# Patient Record
Sex: Male | Born: 1970 | Race: White | Hispanic: Yes | Smoking: Never smoker
Health system: Southern US, Community
[De-identification: ages and names within clinical notes are randomized; demographics above are authoritative.]

## PROBLEM LIST (undated history)

## (undated) DIAGNOSIS — I10 Essential (primary) hypertension: Secondary | ICD-10-CM

## (undated) DIAGNOSIS — E7800 Pure hypercholesterolemia, unspecified: Secondary | ICD-10-CM

## (undated) DIAGNOSIS — N529 Male erectile dysfunction, unspecified: Secondary | ICD-10-CM

## (undated) DIAGNOSIS — E119 Type 2 diabetes mellitus without complications: Secondary | ICD-10-CM

## (undated) DIAGNOSIS — K649 Unspecified hemorrhoids: Secondary | ICD-10-CM

## (undated) HISTORY — PX: NO PAST SURGERIES: SHX2092

---

## 2012-09-26 ENCOUNTER — Emergency Department: Payer: Self-pay | Admitting: Emergency Medicine

## 2014-01-04 ENCOUNTER — Emergency Department: Payer: Self-pay | Admitting: Emergency Medicine

## 2014-07-14 DIAGNOSIS — E119 Type 2 diabetes mellitus without complications: Secondary | ICD-10-CM | POA: Insufficient documentation

## 2014-07-14 DIAGNOSIS — I10 Essential (primary) hypertension: Secondary | ICD-10-CM | POA: Insufficient documentation

## 2015-05-09 ENCOUNTER — Encounter: Payer: Self-pay | Admitting: Emergency Medicine

## 2015-05-09 ENCOUNTER — Ambulatory Visit
Admission: EM | Admit: 2015-05-09 | Discharge: 2015-05-09 | Disposition: A | Discharge status: Home / Self Care | Payer: BLUE CROSS/BLUE SHIELD | Attending: Family Medicine | Admitting: Family Medicine

## 2015-05-09 DIAGNOSIS — K648 Other hemorrhoids: Secondary | ICD-10-CM | POA: Diagnosis not present

## 2015-05-09 DIAGNOSIS — K644 Residual hemorrhoidal skin tags: Secondary | ICD-10-CM

## 2015-05-09 DIAGNOSIS — K5900 Constipation, unspecified: Secondary | ICD-10-CM

## 2015-05-09 HISTORY — DX: Essential (primary) hypertension: I10

## 2015-05-09 HISTORY — DX: Type 2 diabetes mellitus without complications: E11.9

## 2015-05-09 MED ORDER — HYDROCORTISONE 2.5 % RE CREA: TOPICAL_CREAM | RECTAL | Status: DC

## 2015-05-09 MED ORDER — HYDROCORTISONE ACETATE 25 MG RE SUPP: 25.0000 mg | Freq: Two times a day (BID) | RECTAL | Status: DC

## 2015-05-09 NOTE — Discharge Instructions (Signed)
Hemorrhoids Hemorrhoids are swollen veins around the rectum or anus. There are two types of hemorrhoids:  1. Internal hemorrhoids. These occur in the veins just inside the rectum. They may poke through to the outside and become irritated and painful. 2. External hemorrhoids. These occur in the veins outside the anus and can be felt as a painful swelling or hard lump near the anus. CAUSES  Pregnancy.   Obesity.   Constipation or diarrhea.   Straining to have a bowel movement.   Sitting for long periods on the toilet.  Heavy lifting or other activity that caused you to strain.  Anal intercourse. SYMPTOMS   Pain.   Anal itching or irritation.   Rectal bleeding.   Fecal leakage.   Anal swelling.   One or more lumps around the anus.  DIAGNOSIS  Your caregiver may be able to diagnose hemorrhoids by visual examination. Other examinations or tests that may be performed include:   Examination of the rectal area with a gloved hand (digital rectal exam).   Examination of anal canal using a small tube (scope).   A blood test if you have lost a significant amount of blood.  A test to look inside the colon (sigmoidoscopy or colonoscopy). TREATMENT Most hemorrhoids can be treated at home. However, if symptoms do not seem to be getting better or if you have a lot of rectal bleeding, your caregiver may perform a procedure to help make the hemorrhoids get smaller or remove them completely. Possible treatments include:   Placing a rubber band at the base of the hemorrhoid to cut off the circulation (rubber band ligation).   Injecting a chemical to shrink the hemorrhoid (sclerotherapy).   Using a tool to burn the hemorrhoid (infrared light therapy).   Surgically removing the hemorrhoid (hemorrhoidectomy).   Stapling the hemorrhoid to block blood flow to the tissue (hemorrhoid stapling).  HOME CARE INSTRUCTIONS   Eat foods with fiber, such as whole grains, beans,  nuts, fruits, and vegetables. Ask your doctor about taking products with added fiber in them (fibersupplements).  Increase fluid intake. Drink enough water and fluids to keep your urine clear or pale yellow.   Exercise regularly.   Go to the bathroom when you have the urge to have a bowel movement. Do not wait.   Avoid straining to have bowel movements.   Keep the anal area dry and clean. Use wet toilet paper or moist towelettes after a bowel movement.   Medicated creams and suppositories may be used or applied as directed.   Only take over-the-counter or prescription medicines as directed by your caregiver.   Take warm sitz baths for 15-20 minutes, 3-4 times a day to ease pain and discomfort.   Place ice packs on the hemorrhoids if they are tender and swollen. Using ice packs between sitz baths may be helpful.   Put ice in a plastic bag.   Place a towel between your skin and the bag.   Leave the ice on for 15-20 minutes, 3-4 times a day.   Do not use a donut-shaped pillow or sit on the toilet for long periods. This increases blood pooling and pain.  SEEK MEDICAL CARE IF:  You have increasing pain and swelling that is not controlled by treatment or medicine.  You have uncontrolled bleeding.  You have difficulty or you are unable to have a bowel movement.  You have pain or inflammation outside the area of the hemorrhoids. MAKE SURE YOU:  Understand these instructions.  Will watch your condition.  Will get help right away if you are not doing well or get worse. Document Released: 09/07/2000 Document Revised: 08/27/2012 Document Reviewed: 07/15/2012 [facility] Patient Information 2015 [place], [place]. This information is not intended to replace advice given to you by your health care provider. Make sure you discuss any questions you have with your health care provider. Methylcellulose capsules or tablets What is this medicine? METHYLCELLULOSE (meth ill SELL yoo  lose) is a bulk-forming laxative. This medicine is used to treat constipation. This medicine may be used for other purposes; ask your health care provider or pharmacist if you have questions. COMMON BRAND NAME(S): Citrucel, Fiber Therapy What should I tell my health care provider before I take this medicine? They need to know if you have any of these conditions: -blockage of the intestines or bowel -change in bowel habits for more than 14 days -nausea or vomiting -phenylketonuria -stomach pain -trouble swallowing -an unusual or allergic reaction to methylcellulose, other medicines, foods, dyes, or preservatives -pregnant or trying or get pregnant -breast-feeding How should I use this medicine? Take this medicine by mouth with a full glass of water. Follow the directions on the package labeling, or take as directed by your health care professional. Take your medicine at regular intervals. Do not take your medicine more often than directed. Talk to your pediatrician regarding the use of this medicine in children. While this drug may be prescribed for children as young as 50 years old for selected conditions, precautions do apply. Overdosage: If you think you have taken too much of this medicine contact a poison control center or emergency room at once. NOTE: This medicine is only for you. Do not share this medicine with others. What if I miss a dose? If you miss a dose, take it as soon as you can. If it is almost time for your next dose, take only that dose. Do not take double or extra doses. What may interact with this medicine? Interactions are not expected. This list may not describe all possible interactions. Give your health care provider a list of all the medicines, herbs, non-prescription drugs, or dietary supplements you use. Also tell them if you smoke, drink alcohol, or use illegal drugs. Some items may interact with your medicine. What should I watch for while using this  medicine? This medicine can take up to 3 days to work. Check with your doctor or health care professional if your symptoms do not start to get better or if they get worse. See your doctor if you have to treat your constipation for more than 1 week. Avoid taking other medicines within 2 hours of taking this medicine. Drink several glasses of water a day while you are taking this medicine. This will help to relieve constipation and prevent dehydration. What side effects may I notice from receiving this medicine? Side effects that you should report to your doctor or health care professional as soon as possible: -allergic reactions like skin rash, itching or hives, swelling of the face, lips, or tongue -breathing problems -chest pain -nausea, vomiting -rectal bleeding -trouble swallowing Side effects that usually do not require medical attention (report to your doctor or health care professional if they continue or are bothersome): -diarrhea -headache -stomach cramps This list may not describe all possible side effects. Call your doctor for medical advice about side effects. You may report side effects to FDA at 1-800-FDA-1088. Where should I keep my medicine? Keep out of the reach of children.  Store at room temperature between 15 and 30 degrees C (59 and 86 degrees F). Do not freeze. Protect from moisture. Throw away any unused medicine after the expiration date. NOTE: This sheet is a summary. It may not cover all possible information. If you have questions about this medicine, talk to your doctor, pharmacist, or health care provider.  2015, [employer]. (2008-03-29 15:12:13) High-Fiber Diet Fiber is found in fruits, vegetables, and grains. A high-fiber diet encourages the addition of more whole grains, legumes, fruits, and vegetables in your diet. The recommended amount of fiber for adult males is 38 g per day. For adult females, it is 25 g per day. Pregnant and lactating women  should get 28 g of fiber per day. If you have a digestive or bowel problem, ask your caregiver for advice before adding high-fiber foods to your diet. Eat a variety of high-fiber foods instead of only a select few type of foods.  PURPOSE 3. To increase stool bulk. 4. To make bowel movements more regular to prevent constipation. 5. To lower cholesterol. 6. To prevent overeating. WHEN IS THIS DIET USED?  It may be used if you have constipation and hemorrhoids.  It may be used if you have uncomplicated diverticulosis (intestine condition) and irritable bowel syndrome.  It may be used if you need help with weight management.  It may be used if you want to add it to your diet as a protective measure against atherosclerosis, diabetes, and cancer. SOURCES OF FIBER  Whole-grain breads and cereals.  Fruits, such as apples, oranges, bananas, berries, prunes, and pears.  Vegetables, such as green peas, carrots, sweet potatoes, beets, broccoli, cabbage, spinach, and artichokes.  Legumes, such split peas, soy, lentils.  Almonds. FIBER CONTENT IN FOODS Starches and Grains / Dietary Fiber (g)  Cheerios, 1 cup / 3 g  Corn Flakes cereal, 1 cup / 0.7 g  Rice crispy treat cereal, 1 cup / 0.3 g  Instant oatmeal (cooked),  cup / 2 g  Frosted wheat cereal, 1 cup / 5.1 g  Brown, long-grain rice (cooked), 1 cup / 3.5 g  White, long-grain rice (cooked), 1 cup / 0.6 g  Enriched macaroni (cooked), 1 cup / 2.5 g Legumes / Dietary Fiber (g)  Baked beans (canned, plain, or vegetarian),  cup / 5.2 g  Kidney beans (canned),  cup / 6.8 g  Pinto beans (cooked),  cup / 5.5 g Breads and Crackers / Dietary Fiber (g)  Plain or honey graham crackers, 2 squares / 0.7 g  Saltine crackers, 3 squares / 0.3 g  Plain, salted pretzels, 10 pieces / 1.8 g  Whole-wheat bread, 1 slice / 1.9 g  White bread, 1 slice / 0.7 g  Raisin bread, 1 slice / 1.2 g  Plain bagel, 3 oz / 2 g  Flour tortilla,  1 oz / 0.9 g  Corn tortilla, 1 small / 1.5 g  Hamburger or hotdog bun, 1 small / 0.9 g Fruits / Dietary Fiber (g)  Apple with skin, 1 medium / 4.4 g  Sweetened applesauce,  cup / 1.5 g  Banana,  medium / 1.5 g  Grapes, 10 grapes / 0.4 g  Orange, 1 small / 2.3 g  Raisin, 1.5 oz / 1.6 g  Melon, 1 cup / 1.4 g Vegetables / Dietary Fiber (g)  Green beans (canned),  cup / 1.3 g  Carrots (cooked),  cup / 2.3 g  Broccoli (cooked),  cup / 2.8 g  Peas (cooked),  cup / 4.4 g  Mashed potatoes,  cup / 1.6 g  Lettuce, 1 cup / 0.5 g  Corn (canned),  cup / 1.6 g  Tomato,  cup / 1.1 g Document Released: 09/10/2005 Document Revised: 03/11/2012 Document Reviewed: 12/13/2011 [facility] Patient Information 2015 [place], [place]. This information is not intended to replace advice given to you by your health care provider. Make sure you discuss any questions you have with your health care provider. Constipation Constipation is when a person has fewer than three bowel movements a week, has difficulty having a bowel movement, or has stools that are dry, hard, or larger than normal. As people grow older, constipation is more common. If you try to fix constipation with medicines that make you have a bowel movement (laxatives), the problem may get worse. Long-term laxative use may cause the muscles of the colon to become weak. A low-fiber diet, not taking in enough fluids, and taking certain medicines may make constipation worse.  CAUSES  7. Certain medicines, such as antidepressants, pain medicine, iron supplements, antacids, and water pills.  8. Certain diseases, such as diabetes, irritable bowel syndrome (IBS), thyroid disease, or depression.  9. Not drinking enough water.  10. Not eating enough fiber-rich foods.  11. Stress or travel.  12. Lack of physical activity or exercise.  13. Ignoring the urge to have a bowel movement.  14. Using laxatives too much.  SIGNS AND SYMPTOMS    Having fewer than three bowel movements a week.   Straining to have a bowel movement.   Having stools that are hard, dry, or larger than normal.   Feeling full or bloated.   Pain in the lower abdomen.   Not feeling relief after having a bowel movement.  DIAGNOSIS  Your health care provider will take a medical history and perform a physical exam. Further testing may be done for severe constipation. Some tests may include:  A barium enema X-ray to examine your rectum, colon, and, sometimes, your small intestine.   A sigmoidoscopy to examine your lower colon.   A colonoscopy to examine your entire colon. TREATMENT  Treatment will depend on the severity of your constipation and what is causing it. Some dietary treatments include drinking more fluids and eating more fiber-rich foods. Lifestyle treatments may include regular exercise. If these diet and lifestyle recommendations do not help, your health care provider may recommend taking over-the-counter laxative medicines to help you have bowel movements. Prescription medicines may be prescribed if over-the-counter medicines do not work.  HOME CARE INSTRUCTIONS   Eat foods that have a lot of fiber, such as fruits, vegetables, whole grains, and beans.  Limit foods high in fat and processed sugars, such as french fries, hamburgers, cookies, candies, and soda.   A fiber supplement may be added to your diet if you cannot get enough fiber from foods.   Drink enough fluids to keep your urine clear or pale yellow.   Exercise regularly or as directed by your health care provider.   Go to the restroom when you have the urge to go. Do not hold it.   Only take over-the-counter or prescription medicines as directed by your health care provider. Do not take other medicines for constipation without talking to your health care provider first.  SEEK IMMEDIATE MEDICAL CARE IF:   You have bright red blood in your stool.   Your  constipation lasts for more than 4 days or gets worse.   You have abdominal or rectal pain.  You have thin, pencil-like stools.   You have unexplained weight loss. MAKE SURE YOU:   Understand these instructions.  Will watch your condition.  Will get help right away if you are not doing well or get worse. Document Released: 06/08/2004 Document Revised: 09/15/2013 Document Reviewed: 06/22/2013 [facility] Patient Information 2015 [place], [place]. This information is not intended to replace advice given to you by your health care provider. Make sure you discuss any questions you have with your health care provider. Disposable Sitz Bath A disposable sitz bath is a plastic basin that fits over the toilet. A bag is hung above the toilet and is connected to a tube that opens into the disposable sitz bath. The bag is filled with warm water that can flow into the basin through the tube.  HOW TO USE A DISPOSABLE SITZ BATH 15. Close the clamp on the tubing before filling the bag with water. This is to prevent leakage. 16. Fill the sitz bath basin and the plastic bag with warm water. 17. Place the filled basin on the toilet with the seat raised. Make sure the overflow opening is facing toward the back of the toilet. 18. Hang the filled plastic bag overhead on a hook or towel rack close to the toilet. When the bag is unclamped, a steady stream of water will flow from the bag, through the tubing, and into the basin. 19. Attach the tubing to the opening on the basin. 20. Sit on the basin positioned on the toilet seat and release the clamp. This will allow warm water to flush the area around your genitals and anus (perineum). 21. Remain sitting on the basin for approximately 15 to 20 minutes. 22. Stand up and pat the perineum area dry. If needed, apply clean bandages (dressings) to the affected area. 23. Tip the basin into the toilet to remove any remaining water and flush the toilet. 24. Wash the basin  with warm water and soap. Let it dry in the sink. 25. Store the basin and tubing in a clean, dry area. 26. Wash your hands with soap and water. SEEK MEDICAL CARE IF: You get worse instead of better. Stop the sitz baths if you get worse. MAKE SURE YOU:  Understand these instructions.  Will watch your condition.  Will get help right away if you are not doing well or get worse. Document Released: 03/11/2012 Document Revised: 06/04/2012 Document Reviewed: 03/11/2012 [facility] Patient Information 2015 [place], [place]. This information is not intended to replace advice given to you by your health care provider. Make sure you discuss any questions you have with your health care provider. Sitz Bath A sitz bath is a warm water bath taken in the sitting position that covers only the hips and buttocks. It may be used for either healing or hygiene purposes. Sitz baths are also used to relieve pain, itching, or muscle spasms. The water may contain medicine. Moist heat will help you heal and relax.  HOME CARE INSTRUCTIONS  Take 3 to 4 sitz baths a day. 27. Fill the bathtub half full with warm water. 28. Sit in the water and open the drain a little. 29. Turn on the warm water to keep the tub half full. Keep the water running constantly. 30. Soak in the water for 15 to 20 minutes. 31. After the sitz bath, pat the affected area dry first. SEEK MEDICAL CARE IF:  You get worse instead of better. Stop the sitz baths if you get worse. MAKE SURE YOU:  Understand these instructions.  Will watch your condition.  Will get help right away if you are not doing well or get worse. Document Released: 06/02/2004 Document Revised: 06/04/2012 Document Reviewed: 12/08/2010 [facility] Patient Information 2015 [place], [place]. This information is not intended to replace advice given to you by your health care provider. Make sure you discuss any questions you have with your health care provider.

## 2015-05-09 NOTE — ED Provider Notes (Signed)
CSN: [id number]     Arrival date & time 05/09/15  1105 History   First MD Initiated Contact with Patient 05/09/15 1149     Chief Complaint  Patient presents with  . Rectal Pain   (Consider location/radiation/quality/duration/timing/severity/associated sxs/prior Treatment) HPI Comments: Married hispanic male here today for buttock pain/lump noted when wiping, tender.  Denied blood in toilet or on toilet paper.  Denied history of hemorrhoids.  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  Typically lifting items 3 hour shifts with coworker.  Has been applying OTC preparation H cream to affected area helping a little.  Denied fever, chills, diarrhea, abdomen pain, rash.  The history is provided by the patient and the spouse.    Past Medical History  Diagnosis Date  . Hypertension   . Diabetes mellitus without complication    History reviewed. No pertinent past surgical history. History reviewed. No pertinent family history. Social History  Substance Use Topics  . Smoking status: Never Smoker   . Smokeless tobacco: None  . Alcohol Use: No    Review of Systems  Constitutional: Negative for fever, chills, diaphoresis, activity change, appetite change and fatigue.  HENT: Negative for congestion, dental problem, drooling, ear discharge, ear pain, facial swelling, hearing loss and mouth sores.   Eyes: Negative for photophobia, pain, discharge, redness, itching and visual disturbance.  Respiratory: Negative for cough, choking, chest tightness, shortness of breath, wheezing and stridor.   Cardiovascular: Negative for chest pain and leg swelling.  Gastrointestinal: Positive for constipation and rectal pain. Negative for nausea, vomiting, abdominal pain, diarrhea, blood in stool, abdominal distention and anal bleeding.  Endocrine: Negative for cold intolerance and heat intolerance.  Genitourinary: Negative for hematuria  and difficulty urinating.  Musculoskeletal: Negative for myalgias, back pain, joint swelling, arthralgias, gait problem, neck pain and neck stiffness.  Skin: Negative for color change, pallor, rash and wound.  Allergic/Immunologic: Positive for immunocompromised state. Negative for environmental allergies and food allergies.  Neurological: Negative for dizziness, tremors, seizures, syncope, facial asymmetry, speech difficulty, weakness, light-headedness, numbness and headaches.  Hematological: Negative for adenopathy. Does not bruise/bleed easily.  Psychiatric/Behavioral: Negative for behavioral problems, confusion, sleep disturbance and agitation.    Allergies  Review of patient's allergies indicates no known allergies.  Home Medications   Prior to Admission medications   Medication Sig Start Date End Date Taking? Authorizing Provider  aspirin 81 MG tablet Take 81 mg by mouth daily.   Yes Historical Provider, MD  lisinopril (PRINIVIL,ZESTRIL) 20 MG tablet Take 20 mg by mouth daily.   Yes Historical Provider, MD  metFORMIN (GLUCOPHAGE) 850 MG tablet Take 850 mg by mouth 2 (two) times daily with a meal.   Yes Historical Provider, MD  hydrocortisone (ANUSOL-HC) 2.5 % rectal cream Apply rectally 3-4 times daily if itching 05/09/15   Barbaraann Barthel, NP  hydrocortisone (ANUSOL-HC) 25 MG suppository Place 1 suppository (25 mg total) rectally 2 (two) times daily. 05/09/15   Jarold Song Breeana Sawtelle, NP   BP 131/70 mmHg  Pulse 91  Temp(Src) 98.3 F (36.8 C) (Oral)  Resp 16  SpO2 97% Physical Exam  Constitutional: He is oriented to person, place, and time. Vital signs are normal. He appears well-developed and well-nourished. No distress.  HENT:  Head: Normocephalic and atraumatic.  Right Ear: External ear normal.  Left Ear: External ear normal.  Nose: Nose normal.  Mouth/Throat: Oropharynx is clear and moist. No  oropharyngeal exudate.  Eyes: Conjunctivae, EOM and lids are normal. Pupils are  equal, round, and reactive to light. Right eye exhibits no discharge. Left eye exhibits no discharge. No scleral icterus.  Neck: Trachea normal and normal range of motion. Neck supple. No tracheal deviation present.  Cardiovascular: Normal rate, regular rhythm, normal heart sounds and intact distal pulses.  Exam reveals no gallop and no friction rub.   No murmur heard. Pulmonary/Chest: Effort normal and breath sounds normal. No stridor. No respiratory distress. He has no wheezes. He has no rales.  Abdominal: Soft. Bowel sounds are normal. He exhibits no distension and no mass. There is no tenderness. There is no rebound and no guarding.  Genitourinary: Rectal exam shows external hemorrhoid and tenderness. Rectal exam shows no fissure and anal tone normal.  RN Blima Ledger chaperoned rectal exam; 1 cm diameter white cream on affected area single 6 oclock position tender skin not erythematous perineum  Musculoskeletal: Normal range of motion. He exhibits no edema or tenderness.  Neurological: He is alert and oriented to person, place, and time. He exhibits normal muscle tone. Coordination normal.  Skin: Skin is warm, dry and intact. No rash noted. He is not diaphoretic. No erythema. No pallor.  Psychiatric: He has a normal mood and affect. His speech is normal and behavior is normal. Judgment and thought content normal. Cognition and memory are normal.  Nursing note and vitals reviewed.   ED Course  Procedures (including critical care time) Labs Review Labs Reviewed - No data to display  Imaging Review No results found.   MDM   1. External hemorrhoid   2. Constipation, unspecified constipation type    Rx anusol suppositories for rectal pain and hydrocortisone rectal cream may use if rectal itching. Work note restriction avoid lifting greater than 50lbs x 7 days.  Avoid holding breath with lifting items or having bowel movements.  Discussed ergonomics and proper lifting techniques to avoid  injury. The following treatments usually help to relieve most cases of hemorrhoids:  . High-fiber diet Eat more high-fiber foods, which will help prevent constipation.  The best sources of fiber are whole-grain cereals, such as shredded wheat or cereals with bran.  Fresh fruit and raw or cooked vegetables, especially asparagus, cabbage, carrots, corn, and broccoli are other good sources of fiber.  If unable to add dietary fiber consider fiber supplement OTC titrate to effect. . Fluids  Drink plenty of water.  This helps to soften bowel movements so they are easier to pass.   . Sitz baths and cold packs Sitting in lukewarm water 2 or 3 times a day for 15 minutes cleans the anal area and may relieve discomfort.  (If the bath water is too hot, swelling around the anus will get worse.)  Also, you might try putting a cloth-covered ice pack on the anus for 10 minutes, 4 times a day.   . Medications  For mild discomfort, your healthcare provider may prescribe a cream or ointment for the painful area.  The cream may contain witch hazel, zinc oxide, or petroleum jelly.  Your provider may also prescribe medicated suppositories to put inside the rectum.  Exitcare handout on hemorrhoids, sitz baths, disposable sitz baths, constipation and high fiber diet given to patient. Patient and spouse agreed with plan of care and verbalized understanding of instructions and had no further questions at this time.  P2:   Diet and fitness  Discussed with patient:  High-fiber diet Eat more high-fiber foods, which will  help prevent constipation.  The best sources of fiber are whole-grain cereals, such as shredded wheat or cereals with bran.  Fresh fruit and raw or cooked vegetables, especially asparagus, cabbage, carrots, corn, and broccoli are other good sources of fiber.   . Fluids  Drink plenty of water.  This helps to soften bowel movements so they are easier to pass.   Exercise.   Attempt diet modification.  Patient given  Exitcare handout on constipation/dietary fiber.  Patient and spouse agreed with plan of care verbalized understanding of information/instructions and had no further questions at this time. P2:  increase fruits/fiber/whole grains and fluid intake  Barbaraann Barthel, NP 05/09/15 1335

## 2015-05-09 NOTE — ED Notes (Signed)
Pt states that he lifted a box on 05/07/2015 night at work and states that he felt the pain in his rectal area

## 2015-05-11 ENCOUNTER — Ambulatory Visit
Admission: EM | Admit: 2015-05-11 | Discharge: 2015-05-11 | Disposition: A | Discharge status: Home / Self Care | Payer: BLUE CROSS/BLUE SHIELD | Attending: Family Medicine | Admitting: Family Medicine

## 2015-05-11 ENCOUNTER — Telehealth: Payer: Self-pay

## 2015-05-11 DIAGNOSIS — K644 Residual hemorrhoidal skin tags: Secondary | ICD-10-CM

## 2015-05-11 DIAGNOSIS — K648 Other hemorrhoids: Secondary | ICD-10-CM

## 2015-05-11 HISTORY — DX: Unspecified hemorrhoids: K64.9

## 2015-05-11 NOTE — ED Notes (Signed)
Examined by Dr. Concha Se. Refuses any treatment. Encouraged that needs to go to ER now for evaluation to rule out any medical problems/crisis. Patient refuses to go to [facility] ER and does not want to go to [facility] ER. AMA form signed and patient left ambulatory. Also noted that patient admits to drinking 3-4 beers daily. Breath smelled of alcohol

## 2015-05-11 NOTE — ED Notes (Signed)
Per Dr. Jolene Provost, patient to have surgical consultation. No referel found, however this nurse took verbal order from Dr. Allena Katz. Michelle at Dr. Marlowe Kays office called and will contact the patient.

## 2015-05-11 NOTE — ED Provider Notes (Signed)
Patient presents today for follow-up regarding external hemorrhoid. Patient states that his symptoms are slightly better. He is using the Anusol HC suppository. He denies any bleeding at this time. Denies fever, abdominal pain. He does have to go to work today and tomorrow where he is required to lift. He has been drinking prune juice and has managed to not be constipated. He did eat some spicy food yesterday which may have aggravated his symptoms.  ROS: Negative except mentioned above.  GENERAL: NAD HEENT: pharyngeal erythema, no exudate RESP: CTA B CARD: RRR RECTAL: tender lesion protruding from the rectum (likely hemorrhoid), does not appear to be thrombosed, no bleeding noted  NEURO: CN II-XII grossly intact   A/P: External hemorrhoid- it appears that the Anusol [facility] is helping, would recommend that patient see GI/General Surgery in case the area gets worse, will make this referral today in Epic, discussed sitz baths, patient's work requires him to lift so we will give him a work excuse for the next 2 days. If any worsening symptoms before his appointment with GI/General Surgery he is to return here.   Jolene Provost, MD 05/11/15 [phone]

## 2015-05-14 ENCOUNTER — Encounter: Payer: Self-pay | Admitting: Gynecology

## 2015-05-14 ENCOUNTER — Ambulatory Visit
Admission: EM | Admit: 2015-05-14 | Discharge: 2015-05-14 | Disposition: A | Discharge status: Home / Self Care | Payer: BLUE CROSS/BLUE SHIELD | Attending: Family Medicine | Admitting: Family Medicine

## 2015-05-14 DIAGNOSIS — K648 Other hemorrhoids: Secondary | ICD-10-CM | POA: Diagnosis not present

## 2015-05-14 DIAGNOSIS — K644 Residual hemorrhoidal skin tags: Secondary | ICD-10-CM

## 2015-05-14 MED ORDER — HYDROCORTISONE ACETATE 25 MG RE SUPP: 25.0000 mg | Freq: Two times a day (BID) | RECTAL | Status: DC

## 2015-05-14 NOTE — ED Provider Notes (Signed)
Patient returns today for follow-up regarding external hemorrhoid. Patient states that the area has started to bleed now. It is slightly more painful now. He only has one more suppository left. His appointment with surgery is on Wednesday. He denies any fevers or chills or constipation. He is worried about returning to work where he has to lift Monday and Tuesday. Denies any other problems.  ROS: Negative except mentioned above. Vitals as per Epic.  GENERAL: NAD RESP: CTA B CARD: RRR RECTAL: mildly thrombosed external hemorrhoid with no acute bleeding at this time NEURO: CN II-XII grossly intact   A/P: External Hemorrhoid- continue to follow the instructions initially given to the patient such as sitz baths, stool softeners, etc. Will refill patient's Anusol HC suppository. He will follow up with general surgery on Wednesday. I will give him a note for work with restrictions that state he is not to lift for the next 2 days. If any acute worsening symptoms patient is to seek medical attention.  Jolene Provost, MD 05/14/15 [phone]

## 2015-05-14 NOTE — ED Notes (Signed)
Per patient seen on 8/15 and 8/17 and not doing any better. Pt. Stated appt. With [facility] surgical on 05/19/2015

## 2015-05-15 ENCOUNTER — Ambulatory Visit
Admission: EM | Admit: 2015-05-15 | Discharge: 2015-05-15 | Disposition: A | Discharge status: Home / Self Care | Payer: BLUE CROSS/BLUE SHIELD

## 2015-05-16 ENCOUNTER — Encounter: Payer: Self-pay | Admitting: Surgery

## 2015-05-16 ENCOUNTER — Ambulatory Visit (INDEPENDENT_AMBULATORY_CARE_PROVIDER_SITE_OTHER): Payer: BLUE CROSS/BLUE SHIELD | Admitting: Surgery

## 2015-05-16 VITALS — BP 166/76 | HR 102 | Temp 97.8°F | Ht 64.0 in | Wt 220.0 lb

## 2015-05-16 DIAGNOSIS — K645 Perianal venous thrombosis: Secondary | ICD-10-CM | POA: Insufficient documentation

## 2015-05-16 DIAGNOSIS — K625 Hemorrhage of anus and rectum: Secondary | ICD-10-CM | POA: Insufficient documentation

## 2015-05-16 MED ORDER — LIDOCAINE 5 % EX OINT: 1.0000 "application " | TOPICAL_OINTMENT | Freq: Three times a day (TID) | CUTANEOUS | Status: DC | PRN

## 2015-05-16 NOTE — Progress Notes (Signed)
Patient ID: John Stephens, male   DOB: 08-05-1971, 44 y.o.   MRN: [id number]  Chief Complaint  Patient presents with  . Hemorrhoids    Severe pain and bleeding    HPI  John Stephens is a 44 y.o. male.  without medical history except for that of diabetes presents to the office with an 8 day history of acute onset of perianal pain followed by bleeding starting last week. He states that he went to work had a lot of heavy lifting to do went home went to bed awoke with the sudden onset of perianal pain and a mass in the anal canal.  He denies any prior history of hemorrhoidal-type symptoms nor hemorrhoid surgery. He denies any prior anal rectal bleeding.  He visited urgent care who started him on Anusol [facility] and referred him to our office for further evaluation and management. He denies any fevers or acute urinary retention. At the anal rectal pain is improving since it sudden onset 8 days ago  Past Medical History  Diagnosis Date  . Hypertension   . Diabetes mellitus without complication   . Hemorrhoids     Past Surgical History  Procedure Laterality Date  . No past surgeries      Family History  Problem Relation Age of Onset  . Diabetes Mother   . Diabetes Father     Social History Social History  Substance Use Topics  . Smoking status: Never Smoker   . Smokeless tobacco: Never Used  . Alcohol Use: No    No Known Allergies  Current Outpatient Prescriptions  Medication Sig Dispense Refill  . aspirin 81 MG tablet Take 81 mg by mouth daily.    Marland Kitchen glipiZIDE (GLUCOTROL) 10 MG tablet Take 1 tablet by mouth 2 (two) times daily.  5  . hydrocortisone (ANUSOL-HC) 2.5 % rectal cream Apply rectally 3-4 times daily if itching 28.35 g 0  . hydrocortisone (ANUSOL-HC) 25 MG suppository Place 1 suppository (25 mg total) rectally 2 (two) times daily. 12 suppository 0  . lidocaine (XYLOCAINE) 5 % ointment Apply 1 application topically 3 (three) times daily as needed for moderate pain (anal  pain). 30 g 0  . lisinopril (PRINIVIL,ZESTRIL) 20 MG tablet Take 20 mg by mouth daily.    Marland Kitchen lovastatin (MEVACOR) 40 MG tablet Take 1 tablet by mouth daily.  1  . metFORMIN (GLUCOPHAGE) 850 MG tablet Take 850 mg by mouth 2 (two) times daily with a meal.     No current facility-administered medications for this visit.    Blood pressure 166/76, pulse 102, temperature 97.8 F (36.6 C), temperature source Oral, height  (1.626 m), weight 220 lb (99.791 kg).  No results found for this or any previous visit (from the past 48 hour(s)). No results found.  Review of Systems  Constitutional: Negative for fever, chills and weight loss.  HENT: Negative for hearing loss.   Respiratory: Negative.   Cardiovascular: Negative.   Gastrointestinal: Positive for blood in stool. Negative for heartburn, nausea, vomiting, abdominal pain, diarrhea, constipation and melena.  Genitourinary: Negative for dysuria, urgency and frequency.  Neurological: Negative.  Negative for headaches.  Psychiatric/Behavioral: Negative.   All other systems reviewed and are negative.   Physical Exam  Constitutional: He is oriented to person, place, and time and well-developed, well-nourished, and in no distress. No distress.  HENT:  Head: Normocephalic.  Eyes: Conjunctivae are normal. Pupils are equal, round, and reactive to light.  Cardiovascular: Normal rate.   Pulmonary/Chest:  Effort normal.  Abdominal: Soft. There is no tenderness.  Genitourinary:  On the right lateral aspect of the anal canal isn't evidence of a recently thrombosed external hemorrhoid. Clot has been fully evacuated. There is no active bleeding present. Rectal examination was deferred secondary to pain and discomfort.  Neurological: He is oriented to person, place, and time.  Skin: Skin is warm and dry. He is not diaphoretic.  Psychiatric: Mood, memory, affect and judgment normal.    Assessment     44 year old male with recently thrombosed ext  hemorrhoid followed by spontaneous rupture and self evacuation of clot.      Plan    I see no indication for surgical intervention at this time.  Will start Lidocaine for symptomatic relief along with anusol HC and have him use gauze to compress the area,  Follow-up in 1 weeks time.     Natale Lay MD, FACS 05/16/2015, 9:55 AM

## 2015-05-18 ENCOUNTER — Telehealth: Payer: Self-pay | Admitting: Family Medicine

## 2015-05-18 ENCOUNTER — Ambulatory Visit: Payer: Self-pay | Admitting: Surgery

## 2015-05-18 ENCOUNTER — Encounter: Payer: Self-pay | Admitting: *Deleted

## 2015-05-18 NOTE — ED Notes (Signed)
Patient came by to get released to work without being seen. Instructed patient to get this release from surgery who I referred him to.  Jolene Provost, MD 05/18/15 1106

## 2015-08-10 ENCOUNTER — Ambulatory Visit
Admission: EM | Admit: 2015-08-10 | Discharge: 2015-08-10 | Disposition: A | Discharge status: Home / Self Care | Payer: BLUE CROSS/BLUE SHIELD | Attending: Family Medicine | Admitting: Family Medicine

## 2015-08-10 ENCOUNTER — Encounter: Payer: Self-pay | Admitting: Emergency Medicine

## 2015-08-10 DIAGNOSIS — J069 Acute upper respiratory infection, unspecified: Secondary | ICD-10-CM | POA: Diagnosis not present

## 2015-08-10 DIAGNOSIS — H65191 Other acute nonsuppurative otitis media, right ear: Secondary | ICD-10-CM

## 2015-08-10 HISTORY — DX: Pure hypercholesterolemia, unspecified: E78.00

## 2015-08-10 MED ORDER — AMOXICILLIN-POT CLAVULANATE 875-125 MG PO TABS: 1.0000 | ORAL_TABLET | Freq: Two times a day (BID) | ORAL | Status: DC

## 2015-08-10 NOTE — ED Notes (Signed)
Pt reports ear pain, sore throat, sinus pressure and chest congestion. Cough started about a week ago and worse since Friday. Reports subjective fever and chills, denies n.v.

## 2015-08-10 NOTE — ED Provider Notes (Signed)
Patient presents today with symptoms of right ear pain, sinus pressure, chest congestion. Patient states that symptoms started a week ago. He has had a subjective fever. He denies any chest pain, shortness of breath, nausea, vomiting, severe headache. Patient has not noticed any discharge from the ears. Patient has diabetes and high blood pressure. He states his medications control both medical problems.  ROS: Negative except mentioned above.  Vitals as per Epic. GENERAL: NAD HEENT: no pharyngeal erythema, no exudate, moderate erythema of right TM with bulging, no discharge from ear, left TM normal, no cervical LAD RESP: CTA B CARD: RRR NEURO: CN II-XII grossly intact   A/P: R Otitis Media, URI- Augmentin prescribed, Tylenol/Motrin when necessary, Delysm prn, Sudafed for a few days, monitor blood pressure while on this medication, seek medical attention if symptoms do persist or worsen as discussed.   Jolene ProvostKirtida Colbi Staubs, MD 08/10/15 [phone]

## 2015-09-21 ENCOUNTER — Ambulatory Visit
Admission: EM | Admit: 2015-09-21 | Discharge: 2015-09-21 | Disposition: A | Discharge status: Home / Self Care | Payer: BLUE CROSS/BLUE SHIELD | Attending: Family Medicine | Admitting: Family Medicine

## 2015-09-21 DIAGNOSIS — J0100 Acute maxillary sinusitis, unspecified: Secondary | ICD-10-CM | POA: Diagnosis not present

## 2015-09-21 DIAGNOSIS — H6091 Unspecified otitis externa, right ear: Secondary | ICD-10-CM | POA: Diagnosis not present

## 2015-09-21 MED ORDER — AZITHROMYCIN 250 MG PO TABS: ORAL_TABLET | ORAL | Status: DC

## 2015-09-21 MED ORDER — CIPROFLOXACIN-DEXAMETHASONE 0.3-0.1 % OT SUSP: 4.0000 [drp] | Freq: Two times a day (BID) | OTIC | Status: AC

## 2015-09-21 NOTE — Discharge Instructions (Signed)
Use medication as prescribed. Avoid ear plugs, and use full ear protection as discussed.   Follow up with your primary care physician this week as needed. Follow up with Ear, Nose and Throat this week as discussed.   Return to Urgent care for new or worsening concerns.   Otitis Externa Otitis externa is a bacterial or fungal infection of the outer ear canal. This is the area from the eardrum to the outside of the ear. Otitis externa is sometimes called "swimmer's ear." CAUSES  Possible causes of infection include:  Swimming in dirty water.  Moisture remaining in the ear after swimming or bathing.  Mild injury (trauma) to the ear.  Objects stuck in the ear (foreign body).  Cuts or scrapes (abrasions) on the outside of the ear. SIGNS AND SYMPTOMS  The first symptom of infection is often itching in the ear canal. Later signs and symptoms may include swelling and redness of the ear canal, ear pain, and yellowish-white fluid (pus) coming from the ear. The ear pain may be worse when pulling on the earlobe. DIAGNOSIS  Your health care provider will perform a physical exam. A sample of fluid may be taken from the ear and examined for bacteria or fungi. TREATMENT  Antibiotic ear drops are often given for 10 to 14 days. Treatment may also include pain medicine or corticosteroids to reduce itching and swelling. HOME CARE INSTRUCTIONS   Apply antibiotic ear drops to the ear canal as prescribed by your health care provider.  Take medicines only as directed by your health care provider.  If you have diabetes, follow any additional treatment instructions from your health care provider.  Keep all follow-up visits as directed by your health care provider. PREVENTION   Keep your ear dry. Use the corner of a towel to absorb water out of the ear canal after swimming or bathing.  Avoid scratching or putting objects inside your ear. This can damage the ear canal or remove the protective wax that  lines the canal. This makes it easier for bacteria and fungi to grow.  Avoid swimming in lakes, polluted water, or poorly chlorinated pools.  You may use ear drops made of rubbing alcohol and vinegar after swimming. Combine equal parts of white vinegar and alcohol in a bottle. Put 3 or 4 drops into each ear after swimming. SEEK MEDICAL CARE IF:   You have a fever.  Your ear is still red, swollen, painful, or draining pus after 3 days.  Your redness, swelling, or pain gets worse.  You have a severe headache.  You have redness, swelling, pain, or tenderness in the area behind your ear. MAKE SURE YOU:   Understand these instructions.  Will watch your condition.  Will get help right away if you are not doing well or get worse.   This information is not intended to replace advice given to you by your health care provider. Make sure you discuss any questions you have with your health care provider.   Document Released: 09/10/2005 Document Revised: 10/01/2014 Document Reviewed: 09/27/2011 Elsevier Interactive Patient Education 2016 [employer]  Sinusitis, Adult Sinusitis is redness, soreness, and inflammation of the paranasal sinuses. Paranasal sinuses are air pockets within the bones of your face. They are located beneath your eyes, in the middle of your forehead, and above your eyes. In healthy paranasal sinuses, mucus is able to drain out, and air is able to circulate through them by way of your nose. However, when your paranasal sinuses are inflamed,  mucus and air can become trapped. This can allow bacteria and other germs to grow and cause infection. Sinusitis can develop quickly and last only a short time (acute) or continue over a long period (chronic). Sinusitis that lasts for more than 12 weeks is considered chronic. CAUSES Causes of sinusitis include:  Allergies.  Structural abnormalities, such as displacement of the cartilage that separates your nostrils (deviated septum),  which can decrease the air flow through your nose and sinuses and affect sinus drainage.  Functional abnormalities, such as when the small hairs (cilia) that line your sinuses and help remove mucus do not work properly or are not present. SIGNS AND SYMPTOMS Symptoms of acute and chronic sinusitis are the same. The primary symptoms are pain and pressure around the affected sinuses. Other symptoms include:  Upper toothache.  Earache.  Headache.  Bad breath.  Decreased sense of smell and taste.  A cough, which worsens when you are lying flat.  Fatigue.  Fever.  Thick drainage from your nose, which often is green and may contain pus (purulent).  Swelling and warmth over the affected sinuses. DIAGNOSIS Your health care provider will perform a physical exam. During your exam, your health care provider may perform any of the following to help determine if you have acute sinusitis or chronic sinusitis:  Look in your nose for signs of abnormal growths in your nostrils (nasal polyps).  Tap over the affected sinus to check for signs of infection.  View the inside of your sinuses using an imaging device that has a light attached (endoscope). If your health care provider suspects that you have chronic sinusitis, one or more of the following tests may be recommended:  Allergy tests.  Nasal culture. A sample of mucus is taken from your nose, sent to a lab, and screened for bacteria.  Nasal cytology. A sample of mucus is taken from your nose and examined by your health care provider to determine if your sinusitis is related to an allergy. TREATMENT Most cases of acute sinusitis are related to a viral infection and will resolve on their own within 10 days. Sometimes, medicines are prescribed to help relieve symptoms of both acute and chronic sinusitis. These may include pain medicines, decongestants, nasal steroid sprays, or saline sprays. However, for sinusitis related to a bacterial  infection, your health care provider will prescribe antibiotic medicines. These are medicines that will help kill the bacteria causing the infection. Rarely, sinusitis is caused by a fungal infection. In these cases, your health care provider will prescribe antifungal medicine. For some cases of chronic sinusitis, surgery is needed. Generally, these are cases in which sinusitis recurs more than 3 times per year, despite other treatments. HOME CARE INSTRUCTIONS  Drink plenty of water. Water helps thin the mucus so your sinuses can drain more easily.  Use a humidifier.  Inhale steam 3-4 times a day (for example, sit in the bathroom with the shower running).  Apply a warm, moist washcloth to your face 3-4 times a day, or as directed by your health care provider.  Use saline nasal sprays to help moisten and clean your sinuses.  Take medicines only as directed by your health care provider.  If you were prescribed either an antibiotic or antifungal medicine, finish it all even if you start to feel better. SEEK IMMEDIATE MEDICAL CARE IF:  You have increasing pain or severe headaches.  You have nausea, vomiting, or drowsiness.  You have swelling around your face.  You have vision  problems.  You have a stiff neck.  You have difficulty breathing.   This information is not intended to replace advice given to you by your health care provider. Make sure you discuss any questions you have with your health care provider.   Document Released: 09/10/2005 Document Revised: 10/01/2014 Document Reviewed: 09/25/2011 Elsevier Interactive Patient Education [employer]

## 2015-09-21 NOTE — ED Notes (Signed)
Pt also feels as if his ear/jaw area is swollen.  Appears slightly larger compared to left ear.

## 2015-09-21 NOTE — ED Provider Notes (Signed)
[facility]  ____________________________________________  Time seen: Approximately 3:47 PM  I have reviewed the triage vital signs and the nursing notes.   HISTORY  Chief Complaint Otalgia  Denies need for Spanish interpreter.   HPI John Stephens is a 44 y.o. male  presents for the complaint of 2 days of right ear pain. Patient does reports for 6-7 days also with runny nose, nasal congestion and intermittent sinus discomfort. Patient denies fall or direct trauma to right ear. Denies decreased hearing to right ear. Patient does report that yesterday he noticed some yellow drainage from right ear. Denies drainage from right ear today. States right ear pain was primarily yesterday at 7 out of 10 aching and throbbing. Patient reports that today minimal pain at 2 out of 10.  Patient reports that he does wear earplugs while at work and states that he also becomes very sweaty. States it is common that he is ears are moist and wet from work.   Patient reports that he was seen in urgent care approximately one month ago and at that time he had a right ear infection. States that he took oral antibiotics and states that that did fully resolve.Denies fevers.   Denies headache, vision changes, neck pain, chest pain, shortness breath or other complaints. Denies dental pain.   PCP: Terance HartBronstein   Past Medical History  Diagnosis Date  . Hypertension   . Diabetes mellitus without complication (HCC)   . Hemorrhoids   . Hypercholesterolemia     Patient Active Problem List   Diagnosis Date Noted  . Anal bleeding 05/16/2015  . Hemorrhoids, external, thrombosed 05/16/2015    Past Surgical History  Procedure Laterality Date  . No past surgeries      Current Outpatient Rx  Name  Route  Sig  Dispense  Refill  .           Marland Kitchen. aspirin 81 MG tablet   Oral   Take 81 mg by mouth daily.         . cholecalciferol (VITAMIN D) 1000 UNITS tablet   Oral   Take 400 Units by mouth daily.        Marland Kitchen. glipiZIDE (GLUCOTROL) 10 MG tablet   Oral   Take 1 tablet by mouth 2 (two) times daily.      5   .           .           .               . lisinopril (PRINIVIL,ZESTRIL) 20 MG tablet   Oral   Take 20 mg by mouth daily.         Marland Kitchen. lovastatin (MEVACOR) 40 MG tablet   Oral   Take 1 tablet by mouth daily.      1   . metFORMIN (GLUCOPHAGE) 850 MG tablet   Oral   Take 850 mg by mouth 2 (two) times daily with a meal.           Allergies Review of patient's allergies indicates no known allergies.  Family History  Problem Relation Age of Onset  . Diabetes Mother   . Diabetes Father     Social History Social History  Substance Use Topics  . Smoking status: Never Smoker   . Smokeless tobacco: Never Used  . Alcohol Use: No    Review of Systems Constitutional: No fever/chills Eyes: No visual changes. ENT: No sore throat. Positive right ear pain.  Positive runny nose, nasal congestion and sinus pressure. Cardiovascular: Denies chest pain. Respiratory: Denies shortness of breath. Gastrointestinal: No abdominal pain.  No nausea, no vomiting.  No diarrhea.  No constipation. Genitourinary: Negative for dysuria. Musculoskeletal: Negative for back pain. Skin: Negative for rash. Neurological: Negative for headaches, focal weakness or numbness.  10-point ROS otherwise negative.  ____________________________________________   PHYSICAL EXAM:  VITAL SIGNS: ED Triage Vitals  Enc Vitals Group     BP 09/21/15 1423 142/84 mmHg     Pulse Rate 09/21/15 1423 84     Resp 09/21/15 1423 16     Temp 09/21/15 1423 98.1 F (36.7 C)     Temp Source 09/21/15 1423 Oral     SpO2 09/21/15 1423 96 %     Weight --      Height --      Head Cir --      Peak Flow --      Pain Score 09/21/15 1427 2     Pain Loc --      Pain Edu? --      Excl. in GC? --     Constitutional: Alert and oriented. Well appearing and in no acute distress. Eyes: Conjunctivae are normal. PERRL.  EOMI. Head: Atraumatic. Mild tenderness to palpation bilateral frontal and maxillary sinuses. No swelling. No erythema. No noted facial swelling.  Ears: Left: No erythema, normal TM. Right: Nontender to palpation, moderate amount of yellowish whitish discharge present in the ear canal with mild canal swelling, sterile swab used to remove some drainage, unable to fully visualize TM, TM appears intact. No surrounding swelling or erythema.Hearing grossly intact bilaterally.   Nose: Nasal congestion with bilateral nasal turbinate erythema.  Mouth/Throat: Mucous membranes are moist.  Oropharynx non-erythematous. No tonsillar swelling or exudate.  Neck: No stridor.  No cervical spine tenderness to palpation. Hematological/Lymphatic/Immunilogical: No cervical lymphadenopathy. Cardiovascular: Normal rate, regular rhythm. Grossly normal heart sounds.  Good peripheral circulation. Respiratory: Normal respiratory effort.  No retractions. Lungs CTAB. No wheezes, rales or rhonchi. Good air movement. Gastrointestinal: Soft and nontender. Musculoskeletal: No lower or upper extremity tenderness nor edema.  Neurologic:  Normal speech and language. No gross focal neurologic deficits are appreciated. No gait instability. Skin:  Skin is warm, dry and intact. No rash noted. Psychiatric: Mood and affect are normal. Speech and behavior are normal.  ____________________________________________   LABS (all labs ordered are listed, but only abnormal results are displayed)  Labs Reviewed - No data to display   INITIAL IMPRESSION / ASSESSMENT AND PLAN / ED COURSE  Pertinent labs & imaging results that were available during my care of the patient were reviewed by me and considered in my medical decision making (see chart for details).  Very well-appearing patient. No acute distress. Presents for the complaints of right otalgia and also reports some runny nose, nasal congestion sinus pressure last week. Patient with  right external otitis with exudate present as well as suspect maxillary sinusitis. As patient recently seen in urgent care and treated with right otitis media as well as sinusitis with oral Augmentin, will treat right external otitis with ciprodex as well as sinusitis with oral azithromycin. Counseled regarding follow-up closely with his primary care physician as well as information given informationfor Dr. Yolonda Kida ENT for follow-up.  counseled regarding use of earplugs at work may have contributed to this. Patient states that he plans has already discussed with his boss that he will be using full external ear protection.  Discussed follow up  with Primary care physician this week. Discussed follow up and return parameters including no resolution or any worsening concerns. Patient verbalized understanding and agreed to plan.   ____________________________________________   FINAL CLINICAL IMPRESSION(S) / ED DIAGNOSES  Final diagnoses:  Right otitis externa  Acute maxillary sinusitis, recurrence not specified       Renford Dills, NP 09/21/15 [phone]

## 2015-09-21 NOTE — ED Notes (Signed)
Right ear pain.  Started yesterday.  "noticed yellow stuff coming from ear this afternoon".  Sick contacts are children.  Yesterday felt "a little bit hot", but did not take temp.  Denies hearing issues.

## 2015-11-25 ENCOUNTER — Ambulatory Visit
Admission: EM | Admit: 2015-11-25 | Discharge: 2015-11-25 | Disposition: A | Discharge status: Home / Self Care | Payer: BLUE CROSS/BLUE SHIELD | Attending: Family Medicine | Admitting: Family Medicine

## 2015-11-25 DIAGNOSIS — J0100 Acute maxillary sinusitis, unspecified: Secondary | ICD-10-CM

## 2015-11-25 LAB — RAPID STREP SCREEN (MED CTR MEBANE ONLY): STREPTOCOCCUS, GROUP A SCREEN (DIRECT): NEGATIVE

## 2015-11-25 MED ORDER — CEFUROXIME AXETIL 250 MG PO TABS: ORAL_TABLET | ORAL | Status: DC

## 2015-11-25 MED ORDER — FLUTICASONE PROPIONATE 50 MCG/ACT NA SUSP: 2.0000 | Freq: Every day | NASAL | Status: DC

## 2015-11-25 MED ORDER — BENZONATATE 200 MG PO CAPS: ORAL_CAPSULE | ORAL | Status: DC

## 2015-11-25 NOTE — ED Notes (Signed)
Patient c/o congestion, cough, sore throat, and left ear pain which all started last Monday.  Denies fever/c/n/v or chest pain.

## 2015-11-25 NOTE — ED Provider Notes (Signed)
CSN: [id number]     Arrival date & time 11/25/15  [id number] History   First MD Initiated Contact with Patient 11/25/15 [phone]     Chief Complaint  Patient presents with  . URI   (Consider location/radiation/quality/duration/timing/severity/associated sxs/prior Treatment) HPI  45 year old male presents today with a four-day history of cough congestion with green sputum production due to pain and a sore throat. He is also complaining of pressure along his jaw bilaterally. He denies any chest pain. He's had no nausea vomiting or fever and is afebrile today in the office. In review of his previous visits seen in December with a acute maxillary sinusitis and otitis externa which has not completely cleared.  Past Medical History  Diagnosis Date  . Hypertension   . Diabetes mellitus without complication (HCC)   . Hemorrhoids   . Hypercholesterolemia    Past Surgical History  Procedure Laterality Date  . No past surgeries     Family History  Problem Relation Age of Onset  . Diabetes Mother   . Diabetes Father    Social History  Substance Use Topics  . Smoking status: Never Smoker   . Smokeless tobacco: Never Used  . Alcohol Use: No    Review of Systems  Constitutional: Negative for fever, chills, activity change and fatigue.  HENT: Positive for congestion, ear pain, postnasal drip, rhinorrhea, sinus pressure, sneezing and sore throat.   Respiratory: Positive for cough. Negative for shortness of breath, wheezing and stridor.   All other systems reviewed and are negative.   Allergies  Review of patient's allergies indicates no known allergies.  Home Medications   Prior to Admission medications   Medication Sig Start Date End Date Taking? Authorizing Provider  aspirin 81 MG tablet Take 81 mg by mouth daily.   Yes Historical Provider, MD  cholecalciferol (VITAMIN D) 1000 UNITS tablet Take 400 Units by mouth daily.   Yes Historical Provider, MD  glipiZIDE (GLUCOTROL) 10 MG tablet Take 1  tablet by mouth 2 (two) times daily. 05/03/15  Yes Historical Provider, MD  lisinopril (PRINIVIL,ZESTRIL) 20 MG tablet Take 20 mg by mouth daily.   Yes Historical Provider, MD  lovastatin (MEVACOR) 40 MG tablet Take 1 tablet by mouth daily. 05/03/15  Yes Historical Provider, MD  metFORMIN (GLUCOPHAGE) 850 MG tablet Take 850 mg by mouth 2 (two) times daily with a meal.   Yes Historical Provider, MD  amoxicillin-clavulanate (AUGMENTIN) 875-125 MG tablet Take 1 tablet by mouth every 12 (twelve) hours. 08/10/15   Jolene ProvostKirtida Patel, MD  benzonatate (TESSALON) 200 MG capsule Take one cap TID PRN cough 11/25/15   Lutricia FeilWilliam P Shellyann Wandrey, PA-C  cefUROXime (CEFTIN) 250 MG tablet Take one tablet BID with food 11/25/15   Lutricia FeilWilliam P Dinisha Cai, PA-C  fluticasone (FLONASE) 50 MCG/ACT nasal spray Place 2 sprays into both nostrils daily. 11/25/15   Lutricia FeilWilliam P Suhayb Anzalone, PA-C  hydrocortisone (ANUSOL-HC) 2.5 % rectal cream Apply rectally 3-4 times daily if itching 05/09/15   Barbaraann Barthelina A Betancourt, NP  hydrocortisone (ANUSOL-HC) 25 MG suppository Place 1 suppository (25 mg total) rectally 2 (two) times daily. 05/14/15   Jolene ProvostKirtida Patel, MD  lidocaine (XYLOCAINE) 5 % ointment Apply 1 application topically 3 (three) times daily as needed for moderate pain (anal pain). 05/16/15   Natale LayMark Bird, MD   Meds Ordered and Administered this Visit  Medications - No data to display  BP 133/82 mmHg  Pulse 88  Temp(Src) 98 F (36.7 C) (Oral)  Resp 18  Ht 5\' 5"  (1.651  m)  Wt 230 lb (104.327 kg)  BMI 38.27 kg/m2  SpO2 98% No data found.   Physical Exam  Constitutional: He is oriented to person, place, and time. He appears well-developed and well-nourished. No distress.  HENT:  Head: Normocephalic and atraumatic.  Both TMs are erythematous with air-fluid levels present mostly on the left. There is retraction also present more noticeably on the left. He has a tenderness to percussion over the right maxillary sinus.  Eyes: Conjunctivae are normal. Pupils are  equal, round, and reactive to light. Right eye exhibits no discharge. Left eye exhibits no discharge.  Neck: Normal range of motion. Neck supple.  Pulmonary/Chest: Effort normal and breath sounds normal. No respiratory distress. He has no wheezes. He has no rales.  Musculoskeletal: Normal range of motion. He exhibits no edema or tenderness.  Lymphadenopathy:    He has no cervical adenopathy.  Neurological: He is alert and oriented to person, place, and time.  Skin: Skin is warm and dry. He is not diaphoretic.  Psychiatric: He has a normal mood and affect. His behavior is normal. Judgment and thought content normal.  Nursing note and vitals reviewed.   ED Course  Procedures (including critical care time)  Labs Review Labs Reviewed  RAPID STREP SCREEN (NOT AT [facility])  CULTURE, GROUP A STREP [facility])    Imaging Review No results found.   Visual Acuity Review  Right Eye Distance:   Left Eye Distance:   Bilateral Distance:    Right Eye Near:   Left Eye Near:    Bilateral Near:         MDM   1. Acute maxillary sinusitis, recurrence not specified    Discharge Medication List as of 11/25/2015 10:24 AM    START taking these medications   Details  benzonatate (TESSALON) 200 MG capsule Take one cap TID PRN cough, Normal    cefUROXime (CEFTIN) 250 MG tablet Take one tablet BID with food, Normal    fluticasone (FLONASE) 50 MCG/ACT nasal spray Place 2 sprays into both nostrils daily., Starting 11/25/2015, Until Discontinued, Normal      Plan: 1. Test/x-ray results and diagnosis reviewed with patient 2. rx as per orders; risks, benefits, potential side effects reviewed with patient 3. Recommend supportive treatment with Flonase and Tessalon Perles for his cough. He was taking a Z-Pak in December which does not appear to fully cured his sinusitis with the findings today as well as his serous otitis. His reason I'll switch him to Ceftin at this time. He should follow-up with his  primary care is not improving or he continues to have his symptoms. /u prn if symptoms worsen or don't improve     Lutricia Feil, PA-C 11/25/15 [address] [place], [place] 11/25/15 1029

## 2015-11-25 NOTE — Discharge Instructions (Signed)

## 2015-11-27 LAB — CULTURE, GROUP A STREP (THRC)

## 2016-07-30 ENCOUNTER — Encounter (INDEPENDENT_AMBULATORY_CARE_PROVIDER_SITE_OTHER): Payer: BLUE CROSS/BLUE SHIELD | Admitting: Vascular Surgery

## 2017-01-02 DIAGNOSIS — E7800 Pure hypercholesterolemia, unspecified: Secondary | ICD-10-CM | POA: Insufficient documentation

## 2017-06-18 ENCOUNTER — Ambulatory Visit
Admission: EM | Admit: 2017-06-18 | Discharge: 2017-06-18 | Disposition: A | Discharge status: Other Acute Inpatient Hospital | Payer: BLUE CROSS/BLUE SHIELD | Attending: Emergency Medicine | Admitting: Emergency Medicine

## 2017-06-18 ENCOUNTER — Encounter: Payer: Self-pay | Admitting: Emergency Medicine

## 2017-06-18 DIAGNOSIS — R1011 Right upper quadrant pain: Secondary | ICD-10-CM

## 2017-06-18 NOTE — ED Triage Notes (Signed)
Patient c/o right sided upper abdominal pain that started on Sunday.  Patient denies N/V/D.

## 2017-06-18 NOTE — ED Provider Notes (Signed)
HPI  SUBJECTIVE:  John Stephens is a 46 y.o. male who presents with 2 days of sharp, constant, nonmigratory right upper quadrant pain with radiation to his back. Symptoms are worse with bending over and with going over bumps/vibrations, no alleviating factors. He has not tried anything for this. He has no nausea, vomiting, fevers. No coughing, wheezing, chest pain, shortness of breath. No abdominal distention. No dysuria, urgency, frequency, cloudy or odorous urine. No periumbilical, flank, suprapubic pain. No trauma to the area, no rash. The area does not itch. He has never had symptoms like this before. States that walking is fine and that the car ride over here was not painful. No calf pain, swelling, immobilization, hemoptysis, surgery in the past 4 weeks. No antipyretic in the past 6-8 hours. He last had something to eat and drink at 1300 today. His past medical history of hypertension, diabetes, hypercholesterolemia, bilateral lower extremity venous stasis. No history of coronary disease, MI, gallbladder disease, HIV, pancreatitis, pneumonia, pneumothorax, PE, DVT, UTI, pyelonephritis, nephrolithiasis. Family history negative for gallbladder disease, nephrolithiasis. PMD: Dr. Zada Finders.  Past Medical History:  Diagnosis Date  . Diabetes mellitus without complication (HCC)   . Hemorrhoids   . Hypercholesterolemia   . Hypertension     Past Surgical History:  Procedure Laterality Date  . NO PAST SURGERIES      Family History  Problem Relation Age of Onset  . Diabetes Mother   . Diabetes Father     Social History  Substance Use Topics  . Smoking status: Never Smoker  . Smokeless tobacco: Never Used  . Alcohol use No    No current facility-administered medications for this encounter.   Current Outpatient Prescriptions:  .  aspirin 81 MG tablet, Take 81 mg by mouth daily., Disp: , Rfl:  .  cholecalciferol (VITAMIN D) 1000 UNITS tablet, Take 400 Units by mouth daily., Disp: ,  Rfl:  .  glipiZIDE (GLUCOTROL) 10 MG tablet, Take 1 tablet by mouth 2 (two) times daily., Disp: , Rfl: 5 .  hydrocortisone (ANUSOL-HC) 2.5 % rectal cream, Apply rectally 3-4 times daily if itching, Disp: 28.35 g, Rfl: 0 .  hydrocortisone (ANUSOL-HC) 25 MG suppository, Place 1 suppository (25 mg total) rectally 2 (two) times daily., Disp: 12 suppository, Rfl: 0 .  lisinopril (PRINIVIL,ZESTRIL) 20 MG tablet, Take 20 mg by mouth daily., Disp: , Rfl:  .  lovastatin (MEVACOR) 40 MG tablet, Take 1 tablet by mouth daily., Disp: , Rfl: 1 .  metFORMIN (GLUCOPHAGE) 850 MG tablet, Take 850 mg by mouth 2 (two) times daily with a meal., Disp: , Rfl:   No Known Allergies   ROS  As noted in HPI.   Physical Exam  BP 140/75 (BP Location: Left Arm)   Pulse 88   Temp 98.2 F (36.8 C) (Oral)   Resp 16   Ht  (1.626 m)   Wt 230 lb (104.3 kg)   SpO2 97%   BMI 39.48 kg/m   Constitutional: Well developed, well nourished, no acute distressMoving around comfortably. Eyes: PERRL, EOMI, conjunctiva normal bilaterally HENT: Normocephalic, atraumatic,mucus membranes moist Respiratory: Clear to auscultation bilaterally, no rales, no wheezing, no rhonchi Cardiovascular: Normal rate and rhythm, no murmurs, no gallops, no rubs GI: Soft, nondistended, normal bowel sounds, right upper quadrant tenderness, mild right lower quadrant and left lower quadrant tenderness,, no rebound, no guarding. Negative Murphy, negative McBurney. Tap table test negative.  Back: no CVAT skin: No rash, skin intact Musculoskeletal: No edema, calves symmetric,  mild posterior right calf tenderness,  No tenderness along the inner thigh., no deformities Neurologic: Alert & oriented x 3, CN II-XII grossly intact, no motor deficits, sensation grossly intact Psychiatric: Speech and behavior appropriate   ED Course   Medications - No data to display  No orders of the defined types were placed in this encounter.  No results found  for this or any previous visit (from the past 24 hour(s)). No results found.  ED Clinical Impression  Right upper quadrant abdominal pain   ED Assessment/Plan   Concern for cholelithiasis versus cholecystitis. Doubt PE in the absence of tachycardia, hypoxia even though he does have some right posterior calf tenderness. Doubt pneumonia, pneumothorax, UTI, pyelonephritis although this is in the differential. Ultrasound unavailable at this facility today. Patient wishes to go to the [facility] ER for a comprehensive evaluation. Feel the patient is stable to go by private vehicle. Discussed with him to not have anything to eat or drink until he is evaluated. He agrees to go immediately there.   No orders of the defined types were placed in this encounter.   *This clinic note was created using Dragon dictation software. Therefore, there may be occasional mistakes despite careful proofreading.  ?   Domenick Gong, MD 06/18/17 1620

## 2017-06-18 NOTE — Discharge Instructions (Signed)
I'm concerned that this could be your gallbladder. You need a comprehensive workup including imaging that we just do not have available here at the urgent care center. Do not have anything to eat or drink until you are evaluated by the emergency room providers. Let them know if your pain changes, gets worse, if you start having chest pain or shortness of breath.

## 2017-08-16 ENCOUNTER — Encounter: Payer: Self-pay | Admitting: Emergency Medicine

## 2017-08-16 ENCOUNTER — Emergency Department: Payer: BLUE CROSS/BLUE SHIELD

## 2017-08-16 ENCOUNTER — Emergency Department
Admission: EM | Admit: 2017-08-16 | Discharge: 2017-08-16 | Disposition: A | Discharge status: Home / Self Care | Payer: BLUE CROSS/BLUE SHIELD | Attending: Emergency Medicine | Admitting: Emergency Medicine

## 2017-08-16 ENCOUNTER — Other Ambulatory Visit: Payer: Self-pay

## 2017-08-16 DIAGNOSIS — Y929 Unspecified place or not applicable: Secondary | ICD-10-CM | POA: Diagnosis not present

## 2017-08-16 DIAGNOSIS — E119 Type 2 diabetes mellitus without complications: Secondary | ICD-10-CM | POA: Diagnosis not present

## 2017-08-16 DIAGNOSIS — Z7984 Long term (current) use of oral hypoglycemic drugs: Secondary | ICD-10-CM | POA: Insufficient documentation

## 2017-08-16 DIAGNOSIS — Z7982 Long term (current) use of aspirin: Secondary | ICD-10-CM | POA: Diagnosis not present

## 2017-08-16 DIAGNOSIS — W108XXA Fall (on) (from) other stairs and steps, initial encounter: Secondary | ICD-10-CM | POA: Diagnosis not present

## 2017-08-16 DIAGNOSIS — S82101A Unspecified fracture of upper end of right tibia, initial encounter for closed fracture: Secondary | ICD-10-CM | POA: Diagnosis not present

## 2017-08-16 DIAGNOSIS — Y9301 Activity, walking, marching and hiking: Secondary | ICD-10-CM | POA: Insufficient documentation

## 2017-08-16 DIAGNOSIS — S8991XA Unspecified injury of right lower leg, initial encounter: Secondary | ICD-10-CM | POA: Diagnosis present

## 2017-08-16 DIAGNOSIS — I10 Essential (primary) hypertension: Secondary | ICD-10-CM | POA: Insufficient documentation

## 2017-08-16 DIAGNOSIS — Y998 Other external cause status: Secondary | ICD-10-CM | POA: Insufficient documentation

## 2017-08-16 DIAGNOSIS — Z79899 Other long term (current) drug therapy: Secondary | ICD-10-CM | POA: Insufficient documentation

## 2017-08-16 MED ORDER — OXYCODONE-ACETAMINOPHEN 5-325 MG PO TABS: 1.0000 | ORAL_TABLET | Freq: Four times a day (QID) | ORAL | 0 refills | Status: DC | PRN

## 2017-08-16 MED ORDER — OXYCODONE-ACETAMINOPHEN 5-325 MG PO TABS
1.0000 | ORAL_TABLET | Freq: Once | ORAL | Status: AC
  Administered 2017-08-16: 1 via ORAL
  Filled 2017-08-16: qty 1

## 2017-08-16 NOTE — ED Triage Notes (Signed)
Fell down approx 4 steps , right leg pain , heard a pop , swelling to right knee

## 2017-08-16 NOTE — ED Provider Notes (Signed)
[facility] Emergency Department Provider Note  ____________________________________________  Time seen: Approximately 9:09 AM  I have reviewed the triage vital signs and the nursing notes.   HISTORY  Chief Complaint Leg Pain    HPI John Stephens is a 46 y.o. male presents emergency department for evaluation of right knee pain after falling.  Patient states that his left leg slipped on the steps and he proceeded to fall down 4 steps and landed on his right knee.  He heard a pop.  He has not been able to walk on leg since incident.  He is having some numbness on and off in that leg but this is not different than usual with his diabetes.  He is currently having some mild pain in the back of his knee.  No additional injuries.  No tingling.  Past Medical History:  Diagnosis Date  . Diabetes mellitus without complication (HCC)   . Hemorrhoids   . Hypercholesterolemia   . Hypertension     Patient Active Problem List   Diagnosis Date Noted  . Anal bleeding 05/16/2015  . Hemorrhoids, external, thrombosed 05/16/2015    Past Surgical History:  Procedure Laterality Date  . NO PAST SURGERIES      Prior to Admission medications   Medication Sig Start Date End Date Taking? Authorizing Provider  aspirin 81 MG tablet Take 81 mg by mouth daily.    [provider]  cholecalciferol (VITAMIN D) 1000 UNITS tablet Take 400 Units by mouth daily.    [provider]  glipiZIDE (GLUCOTROL) 10 MG tablet Take 1 tablet by mouth 2 (two) times daily. 05/03/15   [provider]  hydrocortisone (ANUSOL-HC) 2.5 % rectal cream Apply rectally 3-4 times daily if itching 05/09/15   Betancourt, Jarold Songina A, NP  hydrocortisone (ANUSOL-HC) 25 MG suppository Place 1 suppository (25 mg total) rectally 2 (two) times daily. 05/14/15   Jolene ProvostPatel, Kirtida, MD  lisinopril (PRINIVIL,ZESTRIL) 20 MG tablet Take 20 mg by mouth daily.    [provider]  lovastatin (MEVACOR)  40 MG tablet Take 1 tablet by mouth daily. 05/03/15   [provider]  metFORMIN (GLUCOPHAGE) 850 MG tablet Take 850 mg by mouth 2 (two) times daily with a meal.    [provider]  oxyCODONE-acetaminophen (ROXICET) 5-325 MG tablet Take 1 tablet by mouth every 6 (six) hours as needed. 08/16/17 08/16/18  Enid DerryWagner, Amoy Steeves, PA-C    Allergies Patient has no known allergies.  Family History  Problem Relation Age of Onset  . Diabetes Mother   . Diabetes Father     Social History Social History   Tobacco Use  . Smoking status: Never Smoker  . Smokeless tobacco: Never Used  Substance Use Topics  . Alcohol use: No  . Drug use: No     Review of Systems  Constitutional: No fever/chills Cardiovascular: No chest pain. Respiratory: No SOB. Gastrointestinal: No abdominal pain.  No nausea, no vomiting.  Skin: Negative for rash, abrasions, lacerations, ecchymosis.   ____________________________________________   PHYSICAL EXAM:  VITAL SIGNS: ED Triage Vitals  Enc Vitals Group     BP 08/16/17 0815 (!) 143/69     Pulse Rate 08/16/17 0815 98     Resp 08/16/17 0815 18     Temp 08/16/17 0815 98.7 F (37.1 C)     Temp Source 08/16/17 0815 Oral     SpO2 08/16/17 0815 99 %     Weight 08/16/17 0810 232 lb (105.2 kg)  Height 08/16/17 0810 5\' 4"  (1.626 m)     Head Circumference --      Peak Flow --      Pain Score 08/16/17 0809 10     Pain Loc --      Pain Edu? --      Excl. in GC? --      Constitutional: Alert and oriented. Well appearing and in no acute distress. Eyes: Conjunctivae are normal. PERRL. EOMI. Head: Atraumatic. ENT:      Ears:      Nose: No congestion/rhinnorhea.      Mouth/Throat: Mucous membranes are moist.  Neck: No stridor.   Cardiovascular: Normal rate, regular rhythm.  Good peripheral circulation. Respiratory: Normal respiratory effort without tachypnea or retractions. Lungs CTAB. Good air entry to the bases with no decreased or absent  breath sounds. Musculoskeletal: Full range of motion to all extremities. No gross deformities appreciated.  No tenderness to palpation over anterior knee.  Mild tenderness to palpation in popliteal fossa.  Mild swelling to knee.  Compartments are soft.  Cap refill less than 3 seconds. Neurologic:  Normal speech and language. No gross focal neurologic deficits are appreciated.  Skin:  Skin is warm, dry and intact. No rash noted.  1 inch bruise to left biceps.   ____________________________________________   LABS (all labs ordered are listed, but only abnormal results are displayed)  Labs Reviewed - No data to display ____________________________________________  EKG   ____________________________________________  RADIOLOGY Lexine BatonI, Loyd Salvador, personally viewed and evaluated these images (plain radiographs) as part of my medical decision making, as well as reviewing the written report by the radiologist.  Dg Knee Complete 4 Views Right  Result Date: 08/16/2017 CLINICAL DATA:  Acute right knee pain following fall today. Initial encounter. EXAM: RIGHT KNEE - COMPLETE 4+ VIEW COMPARISON:  None. FINDINGS: Intra-articular fractures along the medial and lateral aspects of the tibial spines appear nondisplaced. No evidence of subluxation or dislocation. A small knee effusion is present. Very mild degenerative changes in the patellofemoral compartment noted. IMPRESSION: Proximal tibial fractures along the medial and lateral aspects of the tibial spines. Small knee effusion. Electronically Signed   By: Harmon PierJeffrey  Hu M.D.   On: 08/16/2017 08:39    ____________________________________________    PROCEDURES  Procedure(s) performed:    Procedures    Medications  oxyCODONE-acetaminophen (PERCOCET/ROXICET) 5-325 MG per tablet 1 tablet (1 tablet Oral Given 08/16/17 0912)    __________________________________________   INITIAL IMPRESSION / ASSESSMENT AND PLAN / ED COURSE  Pertinent labs &  imaging results that were available during my care of the patient were reviewed by me and considered in my medical decision making (see chart for details).  Review of the [place] CSRS was performed in accordance of the [facility] prior to dispensing any controlled drugs.   Patient's diagnosis is consistent with tibia fracture.  Vital signs and exam are reassuring. Xray consistent with non displaced medial and lateral proximal tibial fractures. Knee immobilizer was placed and crutches were given.  Patient will be discharged home with prescriptions for Percocet. Patient is to follow up with orthopedics and will call to make an appointment today. Patient is given ED precautions to return to the ED for any worsening or new symptoms.     ____________________________________________  FINAL CLINICAL IMPRESSION(S) / ED DIAGNOSES  Final diagnoses:  Closed fracture of proximal end of right tibia, unspecified fracture morphology, initial encounter      NEW MEDICATIONS STARTED DURING THIS VISIT:  This SmartLink is  deprecated. Use AVSMEDLIST instead to display the medication list for a patient.      This chart was dictated using voice recognition software/Dragon. Despite best efforts to proofread, errors can occur which can change the meaning. Any change was purely unintentional.    Enid Derry, PA-C 08/16/17 1421    Sharyn Creamer, MD 08/16/17 [phone]

## 2017-08-16 NOTE — Discharge Instructions (Signed)
Do not remove knee brace. Do not bear weight on right leg. Call orthopedics today for appointment ASAP.

## 2017-08-16 NOTE — ED Notes (Signed)
ED Provider at bedside. 

## 2017-08-16 NOTE — ED Notes (Signed)
First Nurse Note:  Patient assisted from POV with complaint of right leg pain, states he fell down steps this morning and heard a "pop".  [facility]

## 2017-10-14 ENCOUNTER — Encounter: Payer: Self-pay | Admitting: Emergency Medicine

## 2017-10-14 ENCOUNTER — Other Ambulatory Visit: Payer: Self-pay

## 2017-10-14 ENCOUNTER — Ambulatory Visit
Admission: EM | Admit: 2017-10-14 | Discharge: 2017-10-14 | Disposition: A | Discharge status: Home / Self Care | Payer: BLUE CROSS/BLUE SHIELD | Attending: Emergency Medicine | Admitting: Emergency Medicine

## 2017-10-14 DIAGNOSIS — J029 Acute pharyngitis, unspecified: Secondary | ICD-10-CM | POA: Diagnosis not present

## 2017-10-14 LAB — RAPID STREP SCREEN (MED CTR MEBANE ONLY): Streptococcus, Group A Screen (Direct): NEGATIVE

## 2017-10-14 MED ORDER — IBUPROFEN 600 MG PO TABS: 600.0000 mg | ORAL_TABLET | Freq: Four times a day (QID) | ORAL | 0 refills | Status: DC | PRN

## 2017-10-14 MED ORDER — FLUTICASONE PROPIONATE 50 MCG/ACT NA SUSP: 2.0000 | Freq: Every day | NASAL | 0 refills | Status: DC

## 2017-10-14 NOTE — ED Provider Notes (Signed)
HPI  SUBJECTIVE:  Patient reports sore throat starting 2 days ago.  He tried an unknown allergy mucus pill with some improvement in his symptoms.  No aggravating factors. No fevers    No congestion, rhinorrhea, postnasal drip but reports a cough productive of yellowish mucus No Myalgias No Headache No Rash     No Recent Strep, mono, flu exposure No Abdominal Pain No reflux sxs No Allergy sxs  No Breathing difficulty, voice changes.  States that it feels like his throat is very swollen.   No Drooling No Trismus No abx in past month.  No antipyretic in the past 6-8 hours. Past medical history of GERD, diabetes, hypertension, hypercholesterolemia.  No history of allergies, strep, mono. NWG:[id number], Joycie Peek, MD    Past Medical History:  Diagnosis Date  . Diabetes mellitus without complication (HCC)   . Hemorrhoids   . Hypercholesterolemia   . Hypertension     Past Surgical History:  Procedure Laterality Date  . NO PAST SURGERIES      Family History  Problem Relation Age of Onset  . Diabetes Mother   . Hypertension Mother   . Diabetes Father     Social History   Tobacco Use  . Smoking status: Never Smoker  . Smokeless tobacco: Never Used  Substance Use Topics  . Alcohol use: No  . Drug use: No    No current facility-administered medications for this encounter.   Current Outpatient Medications:  .  aspirin 81 MG tablet, Take 81 mg by mouth daily., Disp: , Rfl:  .  atorvastatin (LIPITOR) 40 MG tablet, Take 40 mg by mouth daily., Disp: , Rfl:  .  cholecalciferol (VITAMIN D) 1000 UNITS tablet, Take 400 Units by mouth daily., Disp: , Rfl:  .  glipiZIDE (GLUCOTROL) 10 MG tablet, Take 1 tablet by mouth 2 (two) times daily., Disp: , Rfl: 5 .  lisinopril (PRINIVIL,ZESTRIL) 20 MG tablet, Take 20 mg by mouth daily., Disp: , Rfl:  .  metFORMIN (GLUCOPHAGE) 850 MG tablet, Take 850 mg by mouth 2 (two) times daily with a meal., Disp: , Rfl:  .   oxyCODONE-acetaminophen (ROXICET) 5-325 MG tablet, Take 1 tablet by mouth every 6 (six) hours as needed., Disp: 12 tablet, Rfl: 0 .  fluticasone (FLONASE) 50 MCG/ACT nasal spray, Place 2 sprays into both nostrils daily., Disp: 16 g, Rfl: 0 .  ibuprofen (ADVIL,MOTRIN) 600 MG tablet, Take 1 tablet (600 mg total) by mouth every 6 (six) hours as needed., Disp: 30 tablet, Rfl: 0  No Known Allergies   ROS  As noted in HPI.   Physical Exam  BP (!) 151/85 (BP Location: Left Arm)   Pulse 97   Temp 98 F (36.7 C) (Oral)   Resp 16   Ht 5\' 5"  (1.651 m)   Wt 230 lb (104.3 kg)   SpO2 97%   BMI 38.27 kg/m   Constitutional: Well developed, well nourished, no acute distress Eyes:  EOMI, conjunctiva normal bilaterally HENT: Normocephalic, atraumatic,mucus membranes moist.  - nasal congestion + slightly erythematous oropharynx - enlarged tonsils - exudates.  No obvious postnasal drip.  Uvula midline.  Respiratory: Normal inspiratory effort Cardiovascular: Normal rate, no murmurs, rubs, gallops GI: nondistended, nontender. No appreciable splenomegaly skin: No rash, skin intact Lymph: -cervical LN  Musculoskeletal: no deformities Neurologic: Alert & oriented x 3, no focal neuro deficits Psychiatric: Speech and behavior appropriate.   ED Course   Medications - No data to display  Orders Placed This Encounter  Procedures  . Rapid strep screen    Standing Status:   Standing    Number of Occurrences:   1  . Culture, group A strep    Standing Status:   Standing    Number of Occurrences:   1    Results for orders placed or performed during the hospital encounter of 10/14/17 (from the past 24 hour(s))  Rapid strep screen     Status: None   Collection Time: 10/14/17  8:12 AM  Result Value Ref Range   Streptococcus, Group A Screen (Direct) NEGATIVE NEGATIVE   No results found.  ED Clinical Impression  Sore throat   ED Assessment/Plan  Rapid strep negative. Obtaining throat culture  to guide antibiotic treatment. Discussed this with patient. We'll contact them if culture is positive, and will call in Appropriate antibiotics. Patient home with Flonase, ibuprofen, Tylenol, Benadryl/Maalox mixture,  either the allergy pill or the "mucus pill" which ever works better for him.  Advised him to not take both.. Patient to followup with PMD when necessary,    Discussed labs,  MDM, plan and followup with patient. Discussed sn/sx that should prompt return to the ED. patient agrees with plan.   Meds ordered this encounter  Medications  . fluticasone (FLONASE) 50 MCG/ACT nasal spray    Sig: Place 2 sprays into both nostrils daily.    Dispense:  16 g    Refill:  0  . ibuprofen (ADVIL,MOTRIN) 600 MG tablet    Sig: Take 1 tablet (600 mg total) by mouth every 6 (six) hours as needed.    Dispense:  30 tablet    Refill:  0     *This clinic note was created using Scientist, clinical (histocompatibility and immunogenetics)Dragon dictation software. Therefore, there may be occasional mistakes despite careful proofreading.     Domenick GongMortenson, Odessie Polzin, MD 10/14/17 2017

## 2017-10-14 NOTE — Discharge Instructions (Signed)
your rapid strep was negative today, so we have sent off a throat culture.  We will contact you and call in the appropriate antibiotics if your culture comes back positive for an infection requiring antibiotic treatment.  Give us a working phone number.  Start some Flonase.  Take either the allergy pill or the "mucus pill", which ever works better for you.  1 gram of Tylenol and 600 mg ibuprofen together 3-4 times a day as needed for pain.  Make sure you drink plenty of extra fluids.  Some people find salt water gargles and  Traditional Medicinal's "Throat Coat" tea helpful. Take 5 mL of liquid Benadryl and 5 mL of Maalox. Mix it together, and then hold it in your mouth for as long as you can and then swallow. You may do this 4 times a day.    Go to www.goodrx.com to look up your medications. This will give you a list of where you can find your prescriptions at the most affordable prices. Or ask the pharmacist what the cash price is, or if they have any other discount programs available to help make your medication more affordable. This can be less expensive than what you would pay with insurance.

## 2017-10-14 NOTE — ED Triage Notes (Signed)
Patient in today c/o sore throat and slight productive cough x 2 days.

## 2017-10-16 LAB — CULTURE, GROUP A STREP (THRC)

## 2017-10-17 ENCOUNTER — Telehealth: Payer: Self-pay

## 2017-10-17 NOTE — Telephone Encounter (Signed)
[facility] Spoke with pt, reports slight improvement. Patient instructed to call back with any questions or concerns. [id number] [facility]

## 2018-01-16 ENCOUNTER — Ambulatory Visit
Admission: EM | Admit: 2018-01-16 | Discharge: 2018-01-16 | Disposition: A | Discharge status: Home / Self Care | Payer: BLUE CROSS/BLUE SHIELD | Attending: Family Medicine | Admitting: Family Medicine

## 2018-01-16 ENCOUNTER — Other Ambulatory Visit: Payer: Self-pay

## 2018-01-16 DIAGNOSIS — R21 Rash and other nonspecific skin eruption: Secondary | ICD-10-CM | POA: Diagnosis not present

## 2018-01-16 MED ORDER — TRIAMCINOLONE ACETONIDE 0.1 % EX OINT: 1.0000 "application " | TOPICAL_OINTMENT | Freq: Two times a day (BID) | CUTANEOUS | 0 refills | Status: DC

## 2018-01-16 MED ORDER — TRIAMCINOLONE ACETONIDE 0.1 % EX OINT: 1.0000 "application " | TOPICAL_OINTMENT | Freq: Two times a day (BID) | CUTANEOUS | 0 refills | Status: AC

## 2018-01-16 NOTE — ED Provider Notes (Signed)
[facility]    CSN: [id number] Arrival date & time: 01/16/18  1111   History   Chief Complaint Chief Complaint  Patient presents with  . Rash   HPI  47 year old male presents with rash.  Patient states that he was mowing his grass on Monday.  He subsequently developed rash on Tuesday.  Rash located diffusely (chest, abdomen, back, thighs).  No new exposures or changes.  Has been using topical calamine lotion and other over-the-counter topicals to aid in his itching.  He states that itching is severe.  No known exacerbating factors.  No other reported symptoms.  No other complaints at this time.  Past Medical History:  Diagnosis Date  . Diabetes mellitus without complication (HCC)   . Hemorrhoids   . Hypercholesterolemia   . Hypertension    Patient Active Problem List   Diagnosis Date Noted  . Anal bleeding 05/16/2015  . Hemorrhoids, external, thrombosed 05/16/2015   Past Surgical History:  Procedure Laterality Date  . NO PAST SURGERIES     Home Medications    Prior to Admission medications   Medication Sig Start Date End Date Taking? Authorizing Provider  aspirin 81 MG tablet Take 81 mg by mouth daily.    [provider]  atorvastatin (LIPITOR) 40 MG tablet Take 40 mg by mouth daily.    [provider]  cholecalciferol (VITAMIN D) 1000 UNITS tablet Take 400 Units by mouth daily.    [provider]  glipiZIDE (GLUCOTROL) 10 MG tablet Take 1 tablet by mouth 2 (two) times daily. 05/03/15   [provider]  lisinopril (PRINIVIL,ZESTRIL) 20 MG tablet Take 20 mg by mouth daily.    [provider]  metFORMIN (GLUCOPHAGE) 850 MG tablet Take 850 mg by mouth 2 (two) times daily with a meal.    [provider]  triamcinolone ointment (KENALOG) 0.1 % Apply 1 application topically 2 (two) times daily. Please disregard prior Rx. Give patient Jar. 01/16/18   Tommie Samsook, Dangelo Guzzetta G, DO    Family History Family History  Problem  Relation Age of Onset  . Diabetes Mother   . Hypertension Mother   . Diabetes Father     Social History Social History   Tobacco Use  . Smoking status: Never Smoker  . Smokeless tobacco: Never Used  Substance Use Topics  . Alcohol use: No  . Drug use: No     Allergies   Patient has no known allergies.   Review of Systems Review of Systems  Constitutional: Negative.   Skin: Positive for rash.   Physical Exam Triage Vital Signs ED Triage Vitals  Enc Vitals Group     BP 01/16/18 1134 135/71     Pulse Rate 01/16/18 1134 97     Resp 01/16/18 1134 20     Temp 01/16/18 1134 98.2 F (36.8 C)     Temp Source 01/16/18 1134 Oral     SpO2 01/16/18 1134 97 %     Weight 01/16/18 1136 235 lb (106.6 kg)     Height 01/16/18 1136 5\' 5"  (1.651 m)     Head Circumference --      Peak Flow --      Pain Score 01/16/18 1135 0     Pain Loc --      Pain Edu? --      Excl. in [place]? --    Updated Vital Signs BP 135/71 (BP Location: Left Arm)   Pulse 97   Temp 98.2 F (36.8  C) (Oral)   Resp 20   Ht 5\' 5"  (1.651 m)   Wt 235 lb (106.6 kg)   SpO2 97%   BMI 39.11 kg/m   Physical Exam  Constitutional: He is oriented to person, place, and time. He appears well-developed. No distress.  Cardiovascular: Normal rate and regular rhythm.  Pulmonary/Chest: Effort normal. No respiratory distress.  Neurological: He is alert and oriented to person, place, and time.  Skin:  Diffuse, erythematous rash.  No vesicles appreciated.  Psychiatric: He has a normal mood and affect. His behavior is normal.  Nursing note and vitals reviewed.  UC Treatments / Results  Labs (all labs ordered are listed, but only abnormal results are displayed) Labs Reviewed - No data to display  EKG None Radiology No results found.  Procedures Procedures (including critical care time)  Medications Ordered in UC Medications - No data to display   Initial Impression / Assessment and Plan / UC Course  I have  reviewed the triage vital signs and the nursing notes.  Pertinent labs & imaging results that were available during my care of the patient were reviewed by me and considered in my medical decision making (see chart for details).     47 year old male presents with rash.  Does not appear to be consistent with poison oak or poison ivy.  Etiology is uncertain this time but appears to be contact/irritant in origin.  Treating with topical triamcinolone as he has uncontrolled diabetes and I want to avoid worsening his hyperglycemia.  Final Clinical Impressions(s) / UC Diagnoses   Final diagnoses:  Rash    ED Discharge Orders        Ordered    triamcinolone ointment (KENALOG) 0.1 %  2 times daily,   Status:  Discontinued     01/16/18 1208    triamcinolone ointment (KENALOG) 0.1 %  2 times daily     01/16/18 1208     Controlled Substance Prescriptions [place] Controlled Substance Registry consulted? Not Applicable   Tommie Sams, DO 01/16/18 1253

## 2018-01-16 NOTE — ED Triage Notes (Signed)
"  I think I have poison ivy." Rash started on legs after mowing the grass and has moved to his trunk.

## 2018-01-16 NOTE — Discharge Instructions (Signed)
Medication as prescribed.  Take care  Dr. Zoey Bidwell  

## 2019-01-26 IMAGING — CR DG KNEE COMPLETE 4+V*R*
4 series · 4 of 4 positions shown · non-contrast
Comparison: None.

CLINICAL DATA: Acute right knee pain following fall today. Initial
encounter.

EXAM:
RIGHT KNEE - COMPLETE 4+ VIEW

[knee ap]
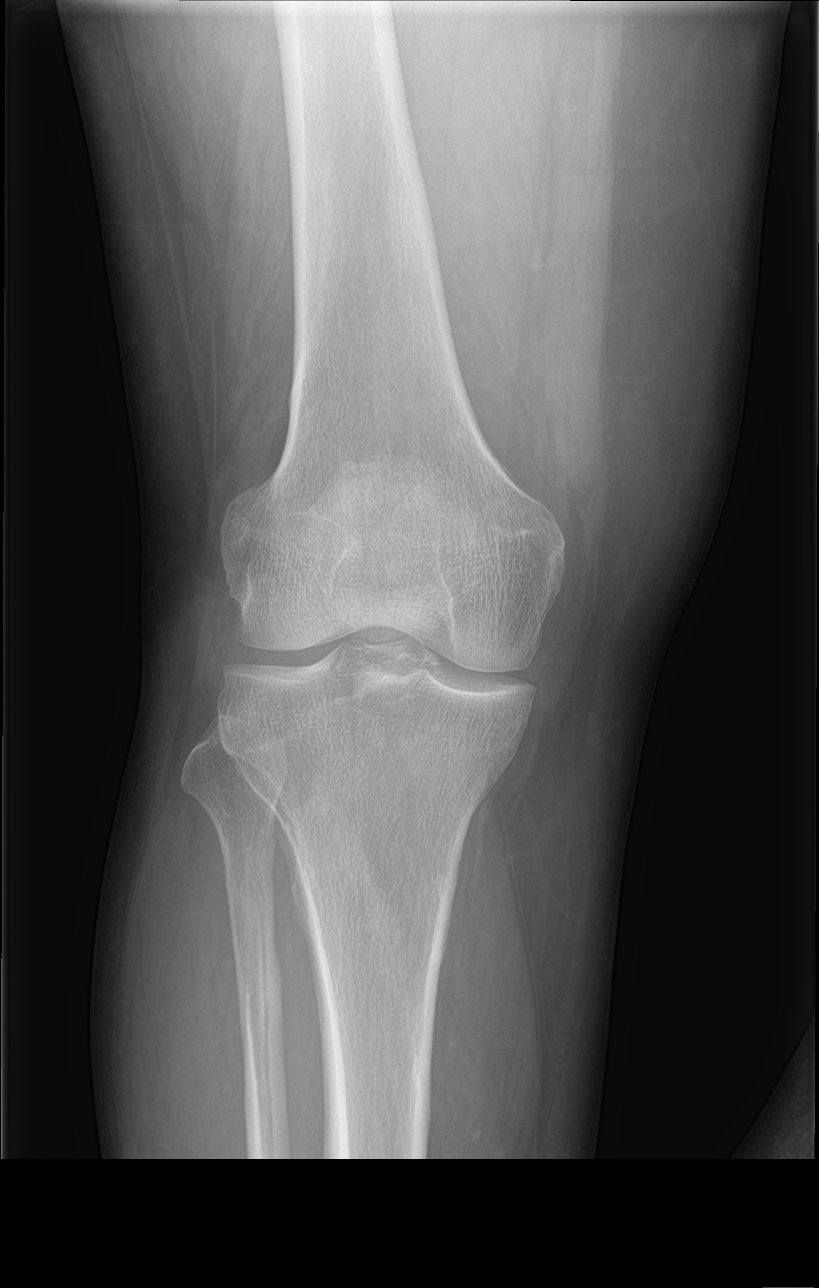

[knee obl (1 of 2)]
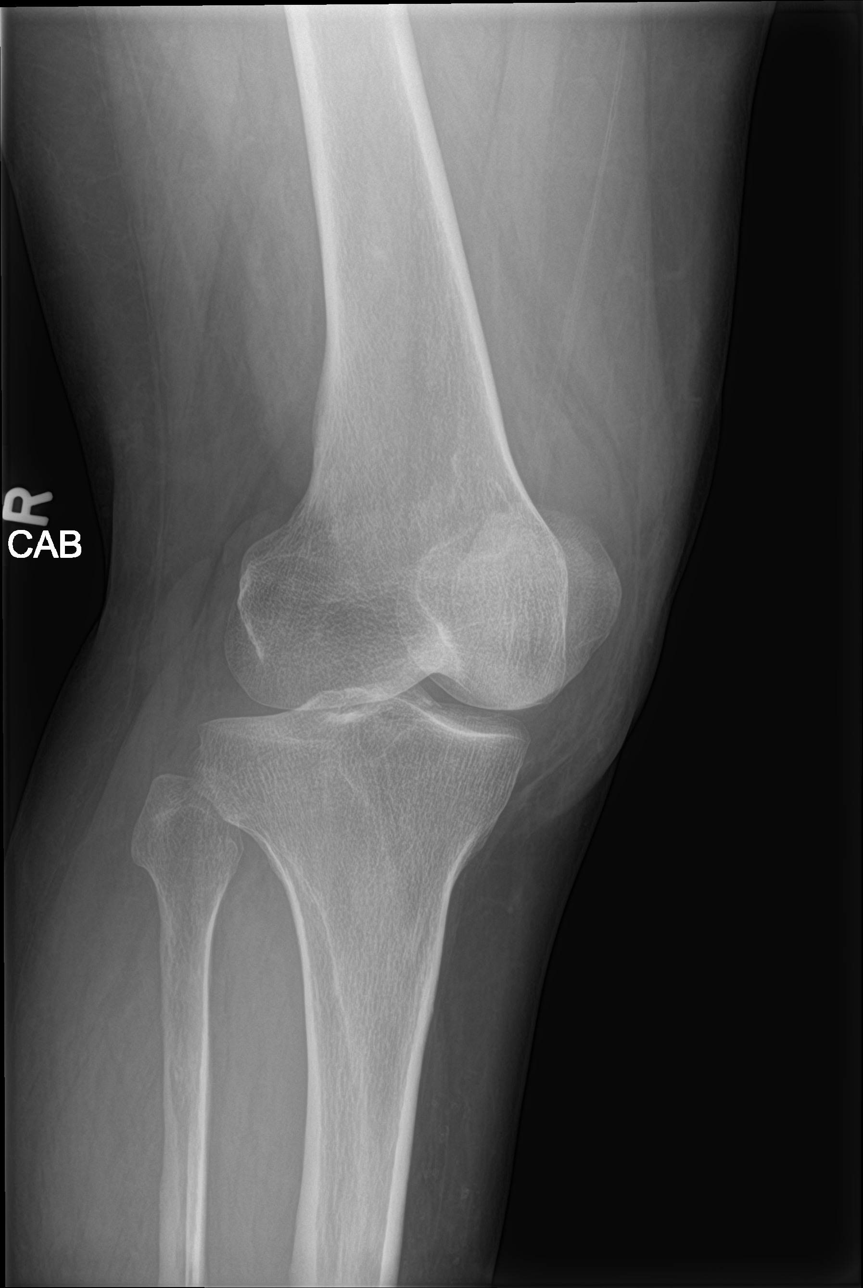

[knee obl (2 of 2)]
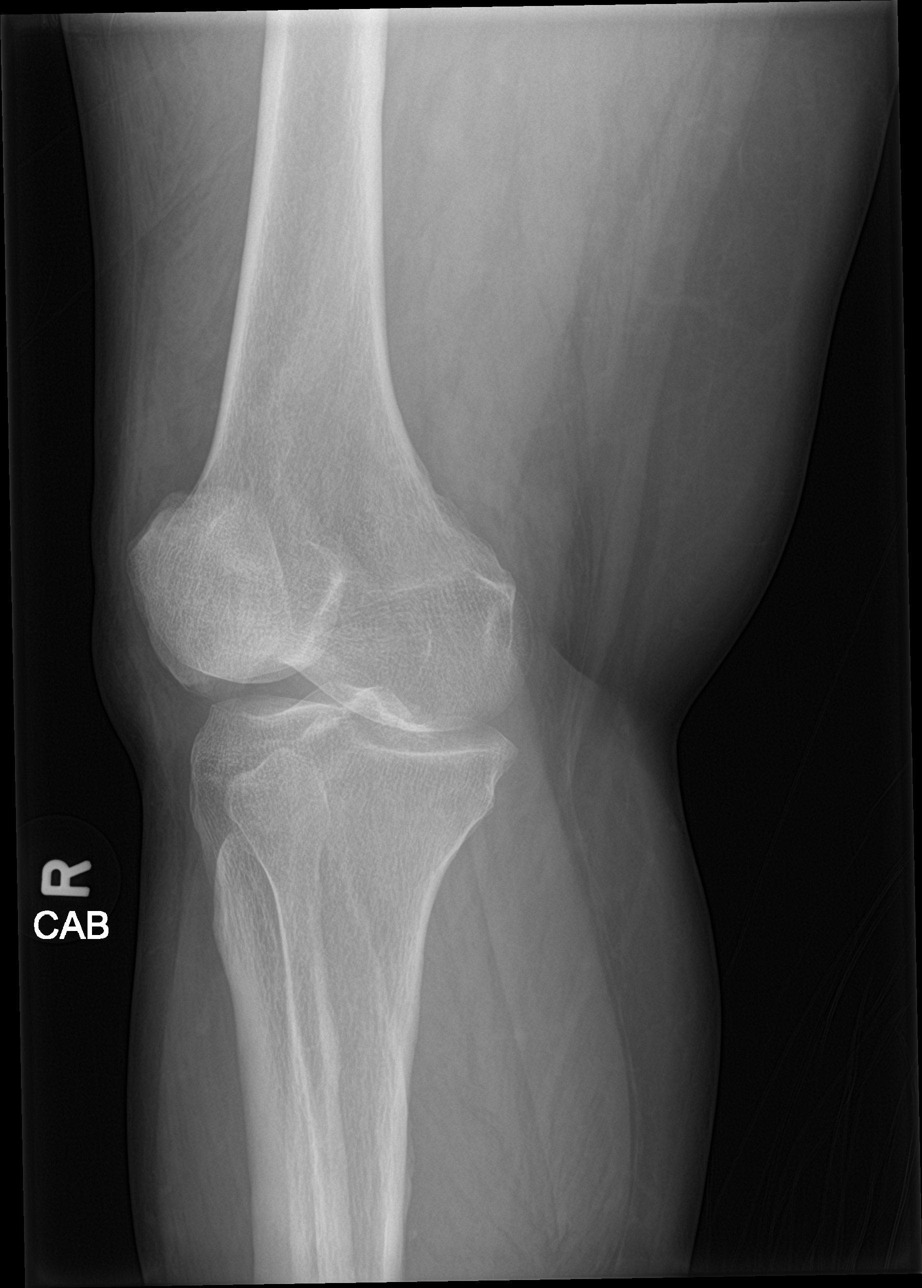

[knee lat]
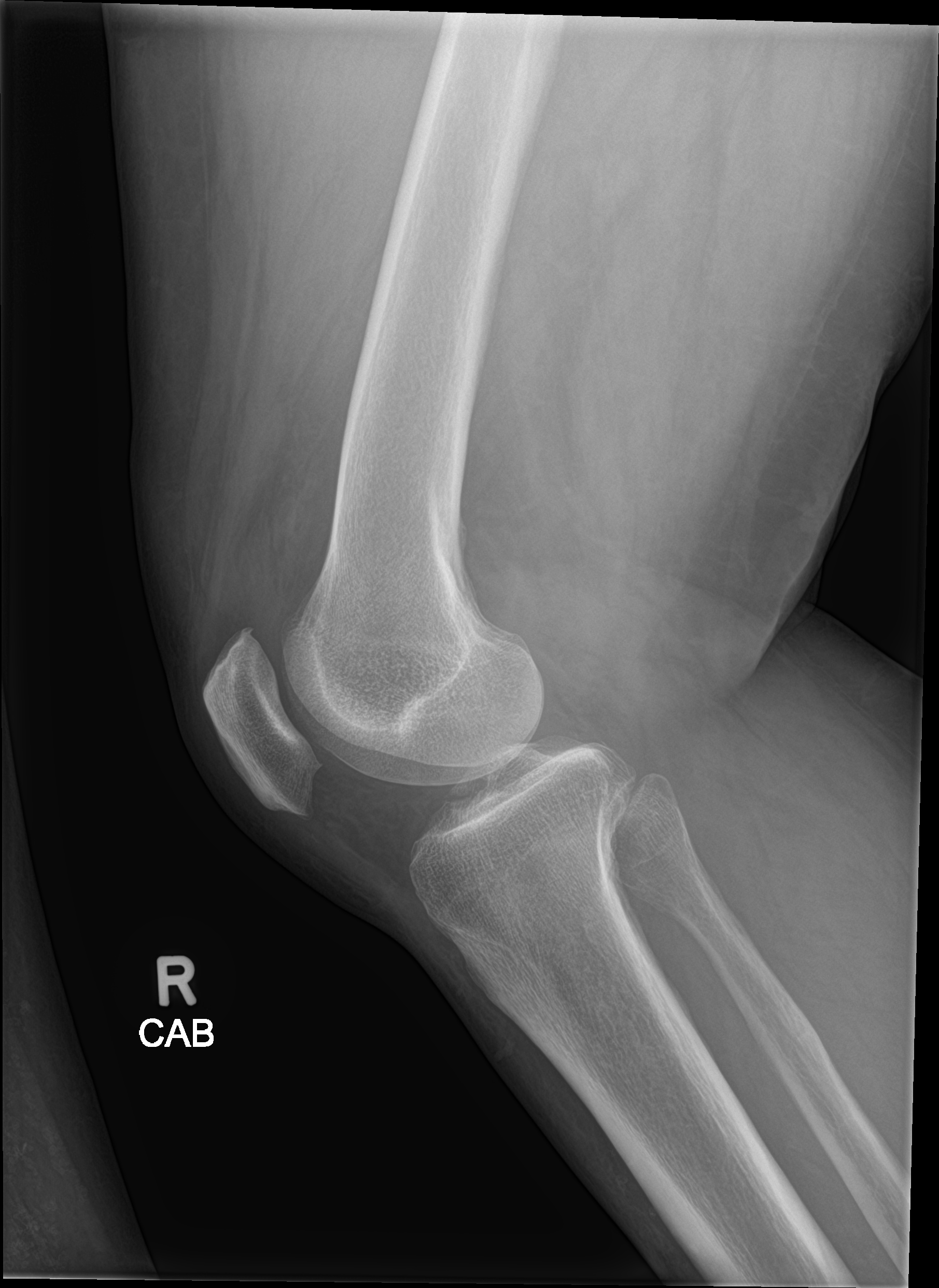

[4 of 4 positions shown; findings below may reference images not displayed]

FINDINGS: Intra-articular fractures along the medial and lateral aspects of
the tibial spines appear nondisplaced.

No evidence of subluxation or dislocation.

A small knee effusion is present.

Very mild degenerative changes in the patellofemoral compartment
noted.
IMPRESSION: Proximal tibial fractures along the medial and lateral aspects of
the tibial spines. Small knee effusion.

## 2020-06-10 ENCOUNTER — Other Ambulatory Visit: Payer: BC Managed Care – PPO

## 2020-06-10 ENCOUNTER — Other Ambulatory Visit: Payer: Self-pay

## 2020-06-10 DIAGNOSIS — Z20822 Contact with and (suspected) exposure to covid-19: Secondary | ICD-10-CM

## 2020-06-13 LAB — NOVEL CORONAVIRUS, NAA: SARS-CoV-2, NAA: NOT DETECTED

## 2020-10-15 ENCOUNTER — Ambulatory Visit: Payer: Self-pay

## 2020-10-16 ENCOUNTER — Other Ambulatory Visit: Payer: Self-pay

## 2020-10-16 ENCOUNTER — Ambulatory Visit
Admission: EM | Admit: 2020-10-16 | Discharge: 2020-10-16 | Disposition: A | Discharge status: Home / Self Care | Payer: BC Managed Care – PPO

## 2020-10-16 ENCOUNTER — Encounter: Payer: Self-pay | Admitting: Emergency Medicine

## 2020-10-16 DIAGNOSIS — B9689 Other specified bacterial agents as the cause of diseases classified elsewhere: Secondary | ICD-10-CM

## 2020-10-16 DIAGNOSIS — H109 Unspecified conjunctivitis: Secondary | ICD-10-CM

## 2020-10-16 DIAGNOSIS — T7840XA Allergy, unspecified, initial encounter: Secondary | ICD-10-CM

## 2020-10-16 HISTORY — DX: Male erectile dysfunction, unspecified: N52.9

## 2020-10-16 MED ORDER — PREDNISONE 20 MG PO TABS: 40.0000 mg | ORAL_TABLET | Freq: Every day | ORAL | 0 refills | Status: AC

## 2020-10-16 MED ORDER — LEVOCETIRIZINE DIHYDROCHLORIDE 5 MG PO TABS: 5.0000 mg | ORAL_TABLET | Freq: Every evening | ORAL | 1 refills | Status: AC

## 2020-10-16 MED ORDER — POLYMYXIN B-TRIMETHOPRIM 10000-0.1 UNIT/ML-% OP SOLN: 1.0000 [drp] | OPHTHALMIC | 0 refills | Status: AC

## 2020-10-16 NOTE — Discharge Instructions (Signed)
You were seen for itchy eyes and itchy throat and are being treated for allergies and bacterial infection.   Take allergy medication eyedrops as prescribed.  Monitor your blood sugar while taking the steroid.  If your fasting blood sugar (morning blood sugar check) is above 200, stop taking prednisone.  Take care, Dr. Sharlet Salina, NP-c

## 2020-10-16 NOTE — ED Provider Notes (Signed)
[facility] - [place], [place]   Name: John Stephens DOB: 1970-11-23 MRN: [id number] CSN: [id number] PCP: Valera Castle, MD  Arrival date and time:  10/16/20 0940  Chief Complaint:  Conjunctivitis, scratchy throat, and Covid Positive (10/12/20)   NOTE: Prior to seeing the patient today, I have reviewed the triage nursing documentation and vital signs. Clinical staff has updated patient's PMH/PSHx, current medication list, and drug allergies/intolerances to ensure comprehensive history available to assist in medical decision making.   History:   HPI: John Stephens is a 50 y.o. male who presents today with complaints of scratchy throat and bilateral itchy eyes.  Patient states he symptoms started approximately 3 days ago.  He has been treating the symptoms with over-the-counter Mucinex and allergy eyedrops and has received minimal relief.  Denies any chest pain, shortness of breath, or wheezing.  Patient does work in a [employer] account [employer] and is near multiple airborne irritants.  He states as though he feels there is something in his eyes and he is consistently itching it.  There is mild clear drainage during the day, and he wakes up with green drainage in the morning.  Of note, patient was recently diagnosed with COVID on January 19.  His quarantine period ended yesterday.   Past Medical History:  Diagnosis Date  . Diabetes mellitus without complication ([place])   . ED (erectile dysfunction)   . Hemorrhoids   . Hypercholesterolemia   . Hypertension     Past Surgical History:  Procedure Laterality Date  . NO PAST SURGERIES      Family History  Problem Relation Age of Onset  . Diabetes Mother   . Hypertension Mother   . Diabetes Father     Social History   Tobacco Use  . Smoking status: Never Smoker  . Smokeless tobacco: Never Used  Vaping Use  . Vaping Use: Never used  Substance Use Topics  . Alcohol use: No  . Drug use: No    Patient Active Problem  List   Diagnosis Date Noted  . Anal bleeding 05/16/2015  . Hemorrhoids, external, thrombosed 05/16/2015    Home Medications:    Current Meds  Medication Sig  . aspirin 81 MG tablet Take 81 mg by mouth daily.  Marland Kitchen atorvastatin (LIPITOR) 40 MG tablet Take 40 mg by mouth daily.  . cholecalciferol (VITAMIN D) 1000 UNITS tablet Take 400 Units by mouth daily.  . Cyanocobalamin (VITAMIN B-12 PO) Take 1 tablet by mouth daily.  Marland Kitchen glipiZIDE (GLUCOTROL) 10 MG tablet Take 1 tablet by mouth 2 (two) times daily.  Marland Kitchen levocetirizine (XYZAL) 5 MG tablet Take 1 tablet (5 mg total) by mouth every evening.  Marland Kitchen lisinopril (PRINIVIL,ZESTRIL) 20 MG tablet Take 20 mg by mouth daily.  . Multiple Vitamin (MULTIVITAMIN) tablet Take 1 tablet by mouth daily.  Marland Kitchen omeprazole (PRILOSEC) 20 MG capsule Take 20 mg by mouth daily.  . pioglitazone-metformin (ACTOPLUS MET) 15-850 MG tablet Take 1 tablet by mouth daily.  . predniSONE (DELTASONE) 20 MG tablet Take 2 tablets (40 mg total) by mouth daily for 3 days.  . sildenafil (REVATIO) 20 MG tablet Take 1 - 5 tablets half an hour before sexual intercourse  . trimethoprim-polymyxin b (POLYTRIM) ophthalmic solution Place 1 drop into both eyes every 4 (four) hours.  . [DISCONTINUED] metFORMIN (GLUCOPHAGE) 850 MG tablet Take 850 mg by mouth 2 (two) times daily with a meal.    Allergies:   No known allergies  Review of  Systems (ROS): Review of Systems  Constitutional: Negative for chills, fatigue and fever.  HENT: Positive for ear pain, postnasal drip, rhinorrhea, sneezing and sore throat.   Eyes: Positive for discharge, redness and itching. Negative for visual disturbance.  Respiratory: Negative for cough.   Cardiovascular: Negative for chest pain.  Gastrointestinal: Negative for diarrhea and nausea.     Vital Signs: Today's Vitals   10/16/20 0951 10/16/20 0952  BP: (!) 141/72   Pulse: (!) 107   Resp: 18   Temp: 98.3 F (36.8 C)   TempSrc: Oral   SpO2: 99%    Weight:  235 lb (106.6 kg)  Height:  _0  (1.626 m)  PainSc:  0-No pain    Physical Exam: Physical Exam Vitals and nursing note reviewed.  Constitutional:      Appearance: Normal appearance.  HENT:     Right Ear: A middle ear effusion is present.     Left Ear: A middle ear effusion is present.  Eyes:     General: Lids are normal. Allergic shiner present.        Right eye: Discharge present. No foreign body.        Left eye: Discharge present.No foreign body.     Extraocular Movements: Extraocular movements intact.     Conjunctiva/sclera:     Right eye: Right conjunctiva is injected. No exudate.    Left eye: Left conjunctiva is injected. No exudate.    Comments: Clear discharge bilaterally  Cardiovascular:     Rate and Rhythm: Normal rate and regular rhythm.     Pulses: Normal pulses.     Heart sounds: Normal heart sounds.  Pulmonary:     Effort: Pulmonary effort is normal.     Breath sounds: Normal breath sounds.  Skin:    General: Skin is warm and dry.  Neurological:     General: No focal deficit present.     Mental Status: He is alert and oriented to person, place, and time.  Psychiatric:        Mood and Affect: Mood normal.        Behavior: Behavior normal.      Urgent Care Treatments / Results:   LABS: PLEASE NOTE: all labs that were ordered this encounter are listed, however only abnormal results are displayed. Labs Reviewed - No data to display  EKG: -None  RADIOLOGY: No results found.  PROCEDURES: Procedures  MEDICATIONS RECEIVED THIS VISIT: Medications - No data to display  PERTINENT CLINICAL COURSE NOTES/UPDATES:   Initial Impression / Assessment and Plan / Urgent Care Course:  Pertinent labs & imaging results that were available during my care of the patient were personally reviewed by me and considered in my medical decision making (see lab/imaging section of note for values and interpretations).  [name] is a 50 y.o. male who  presents to [facility] today with complaints of itchy eyes and sore throat, diagnosed with allergic rhinitis bacterial conjunctivitis, and treated as such with the medications below. NP and patient reviewed discharge instructions below during visit.   Patient is well appearing overall in clinic today. He does not appear to be in any acute distress. Presenting symptoms (see HPI) and exam as documented above.   I have reviewed the follow up and strict return precautions for any new or worsening symptoms. Patient is aware of symptoms that would be deemed urgent/emergent, and would thus require further evaluation either here or in the emergency department. At the time of discharge,  he verbalized understanding and consent with the discharge plan as it was reviewed with him. All questions were fielded by provider and/or clinic staff prior to patient discharge.    Final Clinical Impressions / Urgent Care Diagnoses:   Final diagnoses:  Allergy, initial encounter  Bacterial conjunctivitis of both eyes    New Prescriptions:  [place] Controlled Substance Registry consulted? Not Applicable  Meds ordered this encounter  Medications  . predniSONE (DELTASONE) 20 MG tablet    Sig: Take 2 tablets (40 mg total) by mouth daily for 3 days.    Dispense:  6 tablet    Refill:  0  . levocetirizine (XYZAL) 5 MG tablet    Sig: Take 1 tablet (5 mg total) by mouth every evening.    Dispense:  30 tablet    Refill:  1  . trimethoprim-polymyxin b (POLYTRIM) ophthalmic solution    Sig: Place 1 drop into both eyes every 4 (four) hours.    Dispense:  10 mL    Refill:  0      Discharge Instructions     You were seen for itchy eyes and itchy throat and are being treated for allergies and bacterial infection.   Take allergy medication eyedrops as prescribed.  Monitor your blood sugar while taking the steroid.  If your fasting blood sugar (morning blood sugar check) is above 200, stop taking prednisone.  Take  care, Dr. Marland Kitchen, NP-c     Recommended Follow up Care:  Patient encouraged to follow up with the following provider within the specified time frame, or sooner as dictated by the severity of his symptoms. As always, he was instructed that for any urgent/emergent care needs, he should seek care either here or in the emergency department for more immediate evaluation.   Gertie Baron, DNP, NP-c    Gertie Baron, NP 10/16/20 1036

## 2020-10-16 NOTE — ED Triage Notes (Signed)
Patient in today c/o bilateral eye redness and drainage and scratchy throat x 2 days. Patient tested positive for covid on 10/12/20.

## 2020-10-17 ENCOUNTER — Ambulatory Visit: Payer: Self-pay

## 2021-03-16 ENCOUNTER — Encounter: Payer: Self-pay | Admitting: Emergency Medicine

## 2022-01-03 ENCOUNTER — Ambulatory Visit
Admission: EM | Admit: 2022-01-03 | Discharge: 2022-01-03 | Disposition: A | Discharge status: Home / Self Care | Payer: BC Managed Care – PPO | Attending: Nurse Practitioner | Admitting: Nurse Practitioner

## 2022-01-03 ENCOUNTER — Other Ambulatory Visit: Payer: Self-pay

## 2022-01-03 ENCOUNTER — Encounter: Payer: Self-pay | Admitting: Emergency Medicine

## 2022-01-03 DIAGNOSIS — K029 Dental caries, unspecified: Secondary | ICD-10-CM

## 2022-01-03 DIAGNOSIS — R220 Localized swelling, mass and lump, head: Secondary | ICD-10-CM | POA: Diagnosis not present

## 2022-01-03 MED ORDER — AMOXICILLIN-POT CLAVULANATE 875-125 MG PO TABS: 1.0000 | ORAL_TABLET | Freq: Two times a day (BID) | ORAL | 0 refills | Status: DC

## 2022-01-03 MED ORDER — IBUPROFEN 600 MG PO TABS: 600.0000 mg | ORAL_TABLET | Freq: Four times a day (QID) | ORAL | 0 refills | Status: AC | PRN

## 2022-01-03 NOTE — ED Triage Notes (Signed)
Pt comes in c/o left ear pain that he woke up with this morning that has gotten progressively worse throughout the day. Also endorses neck pain and post-nasal drainage. Denies sore throat, cough, fever.  ?

## 2022-01-03 NOTE — ED Provider Notes (Signed)
?[facility] ? ? ? ?CSN: [id number] ?Arrival date & time: 01/03/22  1641 ? ? ?  ? ?History   ?Chief Complaint ?Chief Complaint  ?Patient presents with  ? Otalgia  ? ? ?HPI ?John Stephens is a 51 y.o. male.  ? ?Subjective: ?  ?KINNETH FUJIWARA is a 51 y.o. male who presents for possible ear infection. Symptoms include left ear pain and mild congestion. Onset of symptoms was earlier today.He denies any fever, chills myalgias, facial pain, lightheadedness, sinus pressure, tinnitus, balance issue or sore throat. The congestion has been present for a while and is consistent with his usual allergies. He has taken mucinex and "allergy pill" with relief in the congestion. He has not tried anything for the ear pain. He denies getting water in the ear or any direct trauma to the face/ear area.  ? ?The following portions of the patient's history were reviewed and updated as appropriate: allergies, current medications, past family history, past medical history, past social history, past surgical history, and problem list. ? ?ntation was completed with the aid of voice recognition software. Transcription may contain typographical errors. ? ? ?Past Medical History:  ?Diagnosis Date  ? Diabetes mellitus without complication ([place])   ? ED (erectile dysfunction)   ? Hemorrhoids   ? Hypercholesterolemia   ? Hypertension   ? ? ?Patient Active Problem List  ? Diagnosis Date Noted  ? Anal bleeding 05/16/2015  ? Hemorrhoids, external, thrombosed 05/16/2015  ? ? ?Past Surgical History:  ?Procedure Laterality Date  ? NO PAST SURGERIES    ? ? ? ? ? ?Home Medications   ? ?Prior to Admission medications   ?Medication Sig Start Date End Date Taking? Authorizing Provider  ?amoxicillin-clavulanate (AUGMENTIN) 875-125 MG tablet Take 1 tablet by mouth every 12 (twelve) hours. 01/03/22  Yes Enrique Sack, FNP  ?aspirin 81 MG tablet Take 81 mg by mouth daily.   Yes [provider]  ?atorvastatin (LIPITOR) 40 MG tablet Take 40  mg by mouth daily.   Yes [provider]  ?cholecalciferol (VITAMIN D) 1000 UNITS tablet Take 400 Units by mouth daily.   Yes [provider]  ?Cyanocobalamin (VITAMIN B-12 PO) Take 1 tablet by mouth daily.   Yes [provider]  ?glipiZIDE (GLUCOTROL) 10 MG tablet Take 1 tablet by mouth 2 (two) times daily. 05/03/15  Yes [provider]  ?ibuprofen (ADVIL) 600 MG tablet Take 1 tablet (600 mg total) by mouth every 6 (six) hours as needed. 01/03/22  Yes Enrique Sack, FNP  ?JARDIANCE 25 MG TABS tablet Take 25 mg by mouth daily. 10/03/20  Yes [provider]  ?levocetirizine (XYZAL) 5 MG tablet Take 1 tablet (5 mg total) by mouth every evening. 10/16/20  Yes Gertie Baron, NP  ?lisinopril (PRINIVIL,ZESTRIL) 20 MG tablet Take 20 mg by mouth daily.   Yes [provider]  ?Multiple Vitamin (MULTIVITAMIN) tablet Take 1 tablet by mouth daily.   Yes [provider]  ?omeprazole (PRILOSEC) 20 MG capsule Take 20 mg by mouth daily.   Yes [provider]  ?pioglitazone-metformin (ACTOPLUS MET) 15-850 MG tablet Take 1 tablet by mouth daily. 08/29/20  Yes [provider]  ?trimethoprim-polymyxin b (POLYTRIM) ophthalmic solution Place 1 drop into both eyes every 4 (four) hours. 10/16/20  Yes Gertie Baron, NP  ?sildenafil (REVATIO) 20 MG tablet Take 1 - 5 tablets half an hour before sexual intercourse 03/09/20   [provider]  ?triamcinolone ointment (KENALOG) 0.1 % Apply 1  application topically 2 (two) times daily. Please disregard prior Rx. Give patient Jar. 01/16/18   Coral Spikes, DO  ? ? ?Family History ?Family History  ?Problem Relation Age of Onset  ? Diabetes Mother   ? Hypertension Mother   ? Diabetes Father   ? ? ?Social History ?Social History  ? ?Tobacco Use  ? Smoking status: Never  ? Smokeless tobacco: Never  ?Vaping Use  ? Vaping Use: Never used  ?Substance Use Topics  ? Alcohol use: No  ? Drug use: No  ? ? ? ?Allergies    ?No known allergies ? ? ?Review of Systems ?Review of Systems  ?Constitutional:  Negative for fever.  ?HENT:  Positive for congestion and ear pain. Negative for hearing loss, sore throat, trouble swallowing and voice change.   ?Gastrointestinal:  Negative for nausea and vomiting.  ?Neurological:  Negative for dizziness and headaches.  ?All other systems reviewed and are negative. ? ? ?Physical Exam ?Triage Vital Signs ?ED Triage Vitals [01/03/22 1737]  ?Enc Vitals Group  ?   BP 125/70  ?   Pulse Rate 91  ?   Resp 17  ?   Temp 98.1 ?F (36.7 ?C)  ?   Temp Source Oral  ?   SpO2 97 %  ?   Weight 230 lb (104.3 kg)  ?   Height 5' 4"  (1.626 m)  ?   Head Circumference   ?   Peak Flow   ?   Pain Score 5  ?   Pain Loc   ?   Pain Edu?   ?   Excl. in [place]?   ? ?No data found. ? ?Updated Vital Signs ?BP 125/70 (BP Location: Left Arm)   Pulse 91   Temp 98.1 ?F (36.7 ?C) (Oral)   Resp 17   Ht 5' 4"  (1.626 m)   Wt 230 lb (104.3 kg)   SpO2 97%   BMI 39.48 kg/m?  ? ?Visual Acuity ?Right Eye Distance:   ?Left Eye Distance:   ?Bilateral Distance:   ? ?Right Eye Near:   ?Left Eye Near:    ?Bilateral Near:    ? ?Physical Exam ?Vitals reviewed.  ?Constitutional:   ?   General: He is not in acute distress. ?   Appearance: Normal appearance. He is not ill-appearing, toxic-appearing or diaphoretic.  ?HENT:  ?   Head: Normocephalic.  ?   Right Ear: Hearing, tympanic membrane, ear canal and external ear normal. No swelling or tenderness. There is no impacted cerumen. Tympanic membrane is not erythematous.  ?   Left Ear: Hearing, tympanic membrane, ear canal and external ear normal. No swelling or tenderness. There is no impacted cerumen. Tympanic membrane is not erythematous.  ?   Nose: Nose normal.  ?   Mouth/Throat:  ?   Lips: Pink.  ?   Mouth: Mucous membranes are dry.  ?   Dentition: Dental caries present.  ?   Pharynx: Oropharynx is clear. Uvula midline. No pharyngeal swelling or uvula swelling.  ? ?   Comments: Dental caries noted  to the upper molars. ?Eyes:  ?   Conjunctiva/sclera: Conjunctivae normal.  ?   Pupils: Pupils are equal, round, and reactive to light.  ?Cardiovascular:  ?   Rate and Rhythm: Normal rate.  ?Pulmonary:  ?   Effort: Pulmonary effort is normal.  ?Musculoskeletal:     ?   General: Normal range of motion.  ?   Cervical back: Normal range of motion  and neck supple. No rigidity or tenderness.  ?Lymphadenopathy:  ?   Cervical: No cervical adenopathy.  ?Skin: ?   General: Skin is warm and dry.  ? ?    ?Neurological:  ?   General: No focal deficit present.  ?   Mental Status: He is alert and oriented to person, place, and time.  ? ? ? ?UC Treatments / Results  ?Labs ?(all labs ordered are listed, but only abnormal results are displayed) ?Labs Reviewed - No data to display ? ?EKG ? ? ?Radiology ?No results found. ? ?Procedures ?Procedures (including critical care time) ? ?Medications Ordered in UC ?Medications - No data to display ? ?Initial Impression / Assessment and Plan / UC Course  ?I have reviewed the triage vital signs and the nursing notes. ? ?Pertinent labs & imaging results that were available during my care of the patient were reviewed by me and considered in my medical decision making (see chart for details). ? ?  ?51 yo presenting with left ear pain.  Upon exam and further questioning, patient's actual area of discomfort is to the left facial region.  Incidentally, it was also noted to have several dental caries to the left upper molars.  Area of tenderness is just over that area on the face.  No erythema or warmth.  No pain to the ear, tragus or mastoid region.  No indication of an acute otitis media or externa.  Patient is afebrile.  Nontoxic.  Will prescribe a course of antibiotics.  Advised to follow-up with dentistry.  Patient has appointment already scheduled with his dentist next month.  Discussed indications for immediate ED or urgent care follow-up. ? ?Today's evaluation has revealed no signs of a  dangerous process. Discussed diagnosis with patient and/or guardian. Patient and/or guardian aware of their diagnosis, possible red flag symptoms to watch out for and need for close follow up. Patient and/or guardia

## 2022-01-03 NOTE — Discharge Instructions (Addendum)
Take medications antibiotics as prescribed. Use motrin as needed for pain. Follow-up with your dentist as scheduled next month or sooner if needed.  ? ?

## 2022-03-16 ENCOUNTER — Ambulatory Visit
Admission: EM | Admit: 2022-03-16 | Discharge: 2022-03-16 | Disposition: A | Discharge status: Home / Self Care | Payer: BC Managed Care – PPO | Attending: Physician Assistant | Admitting: Physician Assistant

## 2022-03-16 ENCOUNTER — Encounter: Payer: Self-pay | Admitting: Emergency Medicine

## 2022-03-16 DIAGNOSIS — K047 Periapical abscess without sinus: Secondary | ICD-10-CM

## 2022-03-16 MED ORDER — CLINDAMYCIN HCL 300 MG PO CAPS: 600.0000 mg | ORAL_CAPSULE | Freq: Three times a day (TID) | ORAL | 0 refills | Status: AC

## 2022-03-16 NOTE — ED Triage Notes (Addendum)
Pt reports left lower dental pain since Tuesday night. States he went to the dentist on Wednesday and was placed on Amoxillin 500 mg for an abscessed tooth. States pain and swelling has not improved.

## 2022-07-09 ENCOUNTER — Ambulatory Visit (INDEPENDENT_AMBULATORY_CARE_PROVIDER_SITE_OTHER): Payer: BC Managed Care – PPO | Admitting: Internal Medicine

## 2022-07-09 VITALS — BP 148/83 | HR 93 | Resp 12 | Ht 64.0 in | Wt 233.8 lb

## 2022-07-09 DIAGNOSIS — K219 Gastro-esophageal reflux disease without esophagitis: Secondary | ICD-10-CM | POA: Insufficient documentation

## 2022-07-09 DIAGNOSIS — I10 Essential (primary) hypertension: Secondary | ICD-10-CM

## 2022-07-09 DIAGNOSIS — E669 Obesity, unspecified: Secondary | ICD-10-CM | POA: Insufficient documentation

## 2022-07-09 DIAGNOSIS — G4733 Obstructive sleep apnea (adult) (pediatric): Secondary | ICD-10-CM | POA: Diagnosis not present

## 2022-07-09 NOTE — Progress Notes (Signed)
Sleep Medicine   Office Visit  Patient Name: John Stephens DOB: [birth year] MRN [id number]    Chief Complaint: sleep evaluation  Brief History:  Saifan presents for an initial evaluation for possible sleep apnea. 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 He reports his sleep quality is good. This is noted most nights. The patient's bed partner reports snoring at night. The patient relates the following symptoms: snoring, sleepiness later in the day, some daytime fatigue are also present. Denies brain fog, lack of focus, headaches. The patient goes to sleep at 9-10pm and wakes up at 4:00am for his job.  Sleep quality is the same when outside home environment.  Patient has noted restlessness of his legs at night.  The patient  relates no unusual behavior during the night.  The patient denies a history of psychiatric problems. The Epworth Sleepiness Score is 5 out of 24 .  STOP BANG SCORE of 6. The patient relates  Cardiovascular risk factors include: hypertension.   ROS  General: (-) fever, (-) chills, (-) night sweat Nose and Sinuses: (-) nasal stuffiness or itchiness, (-) postnasal drip, (-) nosebleeds, (-) sinus trouble. Mouth and Throat: (-) sore throat, (-) hoarseness. Neck: (-) swollen glands, (-) enlarged thyroid, (-) neck pain. Respiratory: - cough, - shortness of breath, - wheezing. Neurologic: - numbness, - tingling. Psychiatric: - anxiety, - depression Sleep behavior: -sleep paralysis -hypnogogic hallucinations -dream enactment      -vivid dreams -cataplexy -night terrors -sleep walking   Current Medication: Outpatient Encounter Medications as of 07/09/2022  Medication Sig Note   atorvastatin (LIPITOR) 40 MG tablet Take 1 tablet by mouth daily.    empagliflozin (JARDIANCE) 25 MG TABS tablet Take 1 tablet by mouth daily.    glipiZIDE (GLUCOTROL) 10 MG tablet Take by mouth.    pioglitazone-metformin (ACTOPLUS MET) 15-850 MG tablet 3 (three) times a day.    aspirin 81 MG  tablet Take 81 mg by mouth daily.    cholecalciferol (VITAMIN D) 1000 UNITS tablet Take 400 Units by mouth daily.    Cyanocobalamin (VITAMIN B-12 PO) Take 1 tablet by mouth daily.    fluticasone (FLONASE) 50 MCG/ACT nasal spray     ibuprofen (ADVIL) 600 MG tablet Take 1 tablet (600 mg total) by mouth every 6 (six) hours as needed.    JARDIANCE 25 MG TABS tablet Take 25 mg by mouth daily.    levocetirizine (XYZAL) 5 MG tablet Take 1 tablet (5 mg total) by mouth every evening.    lisinopril (ZESTRIL) 40 MG tablet Take 40 mg by mouth daily.    Multiple Vitamin (MULTIVITAMIN) tablet Take 1 tablet by mouth daily.    omeprazole (PRILOSEC) 20 MG capsule Take 20 mg by mouth daily.    RYBELSUS 7 MG TABS Take 1 tablet by mouth daily.    sildenafil (REVATIO) 20 MG tablet Take 1 - 5 tablets half an hour before sexual intercourse    triamcinolone ointment (KENALOG) 0.1 % Apply 1 application topically 2 (two) times daily. Please disregard prior Rx. Give patient Jar.    trimethoprim-polymyxin b (POLYTRIM) ophthalmic solution Place 1 drop into both eyes every 4 (four) hours.    [DISCONTINUED] atorvastatin (LIPITOR) 40 MG tablet Take 40 mg by mouth daily.    [DISCONTINUED] glipiZIDE (GLUCOTROL) 10 MG tablet Take 1 tablet by mouth 2 (two) times daily. 05/16/2015: Received from: External Pharmacy Received Sig: TAKE 1 TABLET (10 MG TOTAL) BY MOUTH 2 (TWO) TIMES DAILY BEFORE MEALS.   [DISCONTINUED] lisinopril (  PRINIVIL,ZESTRIL) 20 MG tablet Take 20 mg by mouth daily.    [DISCONTINUED] pioglitazone-metformin (ACTOPLUS MET) 15-850 MG tablet Take 1 tablet by mouth daily.    No facility-administered encounter medications on file as of 07/09/2022.    Surgical History: Past Surgical History:  Procedure Laterality Date   NO PAST SURGERIES      Medical History: Past Medical History:  Diagnosis Date   Diabetes mellitus without complication ([place])    ED (erectile dysfunction)    Hemorrhoids    Hypercholesterolemia     Hypertension     Family History: Non contributory to the present illness  Social History: Social History   Socioeconomic History   Marital status: Widowed    Spouse name: Not on file   Number of children: Not on file   Years of education: Not on file   Highest education level: Not on file  Occupational History   Not on file  Tobacco Use   Smoking status: Never   Smokeless tobacco: Never  Vaping Use   Vaping Use: Never used  Substance and Sexual Activity   Alcohol use: No   Drug use: No   Sexual activity: Not on file  Other Topics Concern   Not on file  Social History Narrative   ** Merged History Encounter **       Social Determinants of Health   Financial Resource Strain: Not on file  Food Insecurity: Not on file  Transportation Needs: Not on file  Physical Activity: Not on file  Stress: Not on file  Social Connections: Not on file  Intimate Partner Violence: Not on file    Vital Signs: Blood pressure (!) 148/83, pulse 93, resp. rate 12, height _0  (1.626 m), weight 233 lb 12.8 oz (106.1 kg), SpO2 97 %. Body mass index is 40.13 kg/m.   Examination: General Appearance: The patient is well-developed, well-nourished, and in no distress. Neck Circumference: 45cm Skin: Gross inspection of skin unremarkable. Head: normocephalic, no gross deformities. Eyes: no gross deformities noted. ENT: ears appear grossly normal Neurologic: Alert and oriented. No involuntary movements.    STOP BANG RISK ASSESSMENT S (snore) Have you been told that you snore?     YES   T (tired) Are you often tired, fatigued, or sleepy during the day?   NO  O (obstruction) Do you stop breathing, choke, or gasp during sleep? NO   P (pressure) Do you have or are you being treated for high blood pressure? YES   B (BMI) Is your body index greater than 35 kg/m? YES   A (age) Are you 53 years old or older? YES   N (neck) Do you have a neck circumference greater than 16 inches?    YES   G (gender) Are you a male? YES   TOTAL STOP/BANG "YES" ANSWERS 6                                                               A STOP-Bang score of 2 or less is considered low risk, and a score of 5 or more is high risk for having either moderate or severe OSA. For people who score 3 or 4, doctors may need to perform further assessment to determine how likely they are to have OSA.  EPWORTH SLEEPINESS SCALE:  Scale:  (0)= no chance of dozing; (1)= slight chance of dozing; (2)= moderate chance of dozing; (3)= high chance of dozing  Chance  Situtation    Sitting and reading: 0    Watching TV: 3    Sitting Inactive in public: 0    As a passenger in car: 0      Lying down to rest: 2    Sitting and talking: 0    Sitting quielty after lunch: 0    In a car, stopped in traffic: 0   TOTAL SCORE:   5 out of 24    SLEEP STUDIES:  No previous sleep study   LABS: No results found for this or any previous visit (from the past 2160 hour(s)).  Radiology: No results found.  No results found.  No results found.    Assessment and Plan: Patient Active Problem List   Diagnosis Date Noted   OSA (obstructive sleep apnea) 07/09/2022   GERD (gastroesophageal reflux disease) 07/09/2022   Obesity (BMI 30-39.9) 07/09/2022   Morbid obesity ([place]) 07/09/2022   Hypercholesterolemia 01/02/2017   Anal bleeding 05/16/2015   Hemorrhoids, external, thrombosed 05/16/2015   Essential hypertension 07/14/2014   Controlled type 2 diabetes mellitus without complication, without long-term current use of insulin ([place]) 07/14/2014   1. OSA (obstructive sleep apnea) PLAN OSA:   Patient evaluation suggests high risk of sleep disordered breathing due to morbid obesity, snoring, large neck, STOP bang score of 6, daytime sleepiness.  Patient has comorbid cardiovascular risk factors including: hypertension which could be exacerbated by pathologic sleep-disordered breathing.  Suggest:  PSG  to assess the patient's sleep disordered breathing. The patient was also counselled on weight loss to optimize sleep health.  2. Essential hypertension Hypertension Counseling:   The following hypertensive lifestyle modification were recommended and discussed:  1. Limiting alcohol intake to less than 1 oz/day of ethanol:(24 oz of beer or 8 oz of wine or 2 oz of 100-proof whiskey). 2. Take baby ASA 81 mg daily. 3. Importance of regular aerobic exercise and losing weight. 4. Reduce dietary saturated fat and cholesterol intake for overall cardiovascular health. 5. Maintaining adequate dietary potassium, calcium, and magnesium intake. 6. Regular monitoring of the blood pressure. 7. Reduce sodium intake to less than 100 mmol/day (less than 2.3 gm of sodium or less than 6 gm of sodium choride)    3. Morbid obesity ([place]) Obesity Counseling: Had a lengthy discussion regarding patients BMI and weight issues. Patient was instructed on portion control as well as increased activity. Also discussed caloric restrictions with trying to maintain intake less than 2000 Kcal. Discussions were made in accordance with the 5As of weight management. Simple actions such as not eating late and if able to, taking a walk is suggested.    .   General Counseling: I have discussed the findings of the evaluation and examination with [facility].  I have also discussed any further diagnostic evaluation thatmay be needed or ordered today. Jontavious verbalizes understanding of the findings of todays visit. We also reviewed his medications today and discussed drug interactions and side effects including but not limited excessive drowsiness and altered mental states. We also discussed that there is always a risk not just to him but also people around him. he has been encouraged to call the office with any questions or concerns that should arise related to todays visit.  No orders of the defined types were placed in this encounter.  I have personally obtained a history, evaluated the patient, evaluated pertinent data, formulated the assessment and plan and placed orders.   This patient was seen today by Tressie Ellis, PA-C in collaboration with Dr. Devona Konig.   Allyne Gee, MD [facility] Diplomate ABMS Pulmonary and Critical Care Medicine Sleep medicine

## 2023-06-17 ENCOUNTER — Ambulatory Visit (INDEPENDENT_AMBULATORY_CARE_PROVIDER_SITE_OTHER): Payer: BC Managed Care – PPO | Admitting: Internal Medicine

## 2023-06-17 VITALS — BP 131/79 | HR 89 | Resp 12 | Ht 64.0 in | Wt 228.0 lb

## 2023-06-17 DIAGNOSIS — G4733 Obstructive sleep apnea (adult) (pediatric): Secondary | ICD-10-CM | POA: Diagnosis not present

## 2023-06-17 DIAGNOSIS — Z7189 Other specified counseling: Secondary | ICD-10-CM | POA: Insufficient documentation

## 2023-06-17 DIAGNOSIS — I10 Essential (primary) hypertension: Secondary | ICD-10-CM | POA: Diagnosis not present

## 2023-06-17 NOTE — Progress Notes (Unsigned)
[facility] [address] [place], [place] [address]  Pulmonary Sleep Medicine   Office Visit Note  Patient Name: John Stephens DOB: 08/03/1971 MRN [id number]    Chief Complaint: Obstructive Sleep Apnea visit  Brief History:  John Stephens is seen today for follow up after setup on CPAP at 13 cmh20.  The patient has a 10 month history of sleep apnea. Patient is using PAP nightly.  The patient feels sometimes rested after sleeping with PAP.  The patient reports benefiting from PAP use. Reported sleepiness is  improved and the Epworth Sleepiness Score is 1 out of 24. The patient sometimes takes naps. The patient complains of the following: Some issues with his eyes watering.  The compliance download shows 100% compliance with an average use time of 6:17 hours. The AHI is 0.9.  The patient does not complain of limb movements disrupting sleep.  ROS  General: (-) fever, (-) chills, (-) night sweat Nose and Sinuses: (-) nasal stuffiness or itchiness, (-) postnasal drip, (-) nosebleeds, (-) sinus trouble. Mouth and Throat: (-) sore throat, (-) hoarseness. Neck: (-) swollen glands, (-) enlarged thyroid, (-) neck pain. Respiratory: - cough, - shortness of breath, - wheezing. Neurologic: - numbness, - tingling. Psychiatric: - anxiety, - depression   Current Medication: Outpatient Encounter Medications as of 06/17/2023  Medication Sig   aspirin 81 MG tablet Take 81 mg by mouth daily.   atorvastatin (LIPITOR) 40 MG tablet Take 1 tablet by mouth daily.   cholecalciferol (VITAMIN D) 1000 UNITS tablet Take 400 Units by mouth daily.   Cyanocobalamin (VITAMIN B-12 PO) Take 1 tablet by mouth daily.   fluticasone (FLONASE) 50 MCG/ACT nasal spray    glipiZIDE (GLUCOTROL) 10 MG tablet Take by mouth.   ibuprofen (ADVIL) 600 MG tablet Take 1 tablet (600 mg total) by mouth every 6 (six) hours as needed.   JARDIANCE 25 MG TABS tablet Take 25 mg by mouth daily.   levocetirizine (XYZAL) 5 MG  tablet Take 1 tablet (5 mg total) by mouth every evening.   lisinopril (ZESTRIL) 40 MG tablet Take 40 mg by mouth daily.   Multiple Vitamin (MULTIVITAMIN) tablet Take 1 tablet by mouth daily.   omeprazole (PRILOSEC) 20 MG capsule Take 20 mg by mouth daily.   pioglitazone-metformin (ACTOPLUS MET) 15-850 MG tablet 3 (three) times a day.   RYBELSUS 14 MG TABS Take 1 tablet by mouth daily.   sildenafil (REVATIO) 20 MG tablet Take 1 - 5 tablets half an hour before sexual intercourse   triamcinolone ointment (KENALOG) 0.1 % Apply 1 application topically 2 (two) times daily. Please disregard prior Rx. Give patient Jar.   trimethoprim-polymyxin b (POLYTRIM) ophthalmic solution Place 1 drop into both eyes every 4 (four) hours.   [DISCONTINUED] empagliflozin (JARDIANCE) 25 MG TABS tablet Take 1 tablet by mouth daily.   [DISCONTINUED] RYBELSUS 7 MG TABS Take 1 tablet by mouth daily.   No facility-administered encounter medications on file as of 06/17/2023.    Surgical History: Past Surgical History:  Procedure Laterality Date   NO PAST SURGERIES      Medical History: Past Medical History:  Diagnosis Date   Diabetes mellitus without complication ([facility])    ED (erectile dysfunction)    Hemorrhoids    Hypercholesterolemia    Hypertension     Family History: Non contributory to the present illness  Social History: Social History   Socioeconomic History   Marital status: Widowed    Spouse name: Not on file  Number of children: Not on file   Years of education: Not on file   Highest education level: Not on file  Occupational History   Not on file  Tobacco Use   Smoking status: Never   Smokeless tobacco: Never  Vaping Use   Vaping status: Never Used  Substance and Sexual Activity   Alcohol use: No   Drug use: No   Sexual activity: Not on file  Other Topics Concern   Not on file  Social History Narrative   ** Merged History Encounter **       Social Determinants of Health    Financial Resource Strain: Not on file  Food Insecurity: Not on file  Transportation Needs: Not on file  Physical Activity: Not on file  Stress: Not on file  Social Connections: Not on file  Intimate Partner Violence: Not on file    Vital Signs: Blood pressure 131/79, pulse 89, resp. rate 12, height 5\' 4"  (1.626 m), weight 228 lb (103.4 kg), SpO2 97%. Body mass index is 39.14 kg/m.    Examination: General Appearance: The patient is well-developed, well-nourished, and in no distress. Neck Circumference: 45 cm Skin: Gross inspection of skin unremarkable. Head: normocephalic, no gross deformities. Eyes: no gross deformities noted. ENT: ears appear grossly normal Neurologic: Alert and oriented. No involuntary movements.  STOP BANG RISK ASSESSMENT S (snore) Have you been told that you snore?     YES   T (tired) Are you often tired, fatigued, or sleepy during the day?   NO  O (obstruction) Do you stop breathing, choke, or gasp during sleep? NO   P (pressure) Do you have or are you being treated for high blood pressure? YES   B (BMI) Is your body index greater than 35 kg/m? YES   A (age) Are you 60 years old or older? YES   N (neck) Do you have a neck circumference greater than 16 inches?   YES   G (gender) Are you a male? YES   TOTAL STOP/BANG "YES" ANSWERS 6       A STOP-Bang score of 2 or less is considered low risk, and a score of 5 or more is high risk for having either moderate or severe OSA. For people who score 3 or 4, doctors may need to perform further assessment to determine how likely they are to have OSA.         EPWORTH SLEEPINESS SCALE:  Scale:  (0)= no chance of dozing; (1)= slight chance of dozing; (2)= moderate chance of dozing; (3)= high chance of dozing  Chance  Situtation    Sitting and reading: 0    Watching TV: 1    Sitting Inactive in public: 0    As a passenger in car: 0      Lying down to rest: 0    Sitting and talking: 0     Sitting quielty after lunch: 0    In a car, stopped in traffic: 0   TOTAL SCORE:   1 out of 24    SLEEP STUDIES:  HST (08/21/22) REI 16.8, min SPO2 76%   CPAP COMPLIANCE DATA:  Date Range: 10/24/22 - 06/12/23  Average Daily Use: 6:17 hours  Median Use: 6:19 hours  Compliance for > 4 Hours: 231 days  AHI: 0.9 respiratory events per hour  Days Used: 232/232  Mask Leak: 4.3  95th Percentile Pressure: 13 cmh20         LABS: No results found for this  or any previous visit (from the past 2160 hour(s)).  Radiology: No results found.  No results found.  No results found.    Assessment and Plan: Patient Active Problem List   Diagnosis Date Noted   OSA (obstructive sleep apnea) 07/09/2022   GERD (gastroesophageal reflux disease) 07/09/2022   Obesity (BMI 30-39.9) 07/09/2022   Morbid obesity ([facility]) 07/09/2022   Hypercholesterolemia 01/02/2017   Anal bleeding 05/16/2015   Hemorrhoids, external, thrombosed 05/16/2015   Essential hypertension 07/14/2014   Controlled type 2 diabetes mellitus without complication, without long-term current use of insulin ([facility]) 07/14/2014   1. OSA (obstructive sleep apnea) The patient does tolerate PAP and reports  benefit from PAP use. The patient was reminded how to cllean equipment and advised to replace supplies routinely. The patient was also counselled on weigth . The compliance is excellent. The AHI is 0.9.   OSA on cpap- controlled. Continue with excellent compliance with pap. CPAP continues to be medically necessary to treat this patient's OSA. F/u one year.     2. CPAP use counseling CPAP Counseling: had a lengthy discussion with the patient regarding the importance of PAP therapy in management of the sleep apnea. Patient appears to understand the risk factor reduction and also understands the risks associated with untreated sleep apnea. Patient will try to make a good faith effort to remain compliant with therapy.  Also instructed the patient on proper cleaning of the device including the water must be changed daily if possible and use of distilled water is preferred. Patient understands that the machine should be regularly cleaned with appropriate recommended cleaning solutions that do not damage the PAP machine for example given white vinegar and water rinses. Other methods such as ozone treatment may not be as good as these simple methods to achieve cleaning.   3. Essential hypertension Hypertension Counseling:   The following hypertensive lifestyle modification were recommended and discussed:  1. Limiting alcohol intake to less than 1 oz/day of ethanol:(24 oz of beer or 8 oz of wine or 2 oz of 100-proof whiskey). 2. Take baby ASA 81 mg daily. 3. Importance of regular aerobic exercise and losing weight. 4. Reduce dietary saturated fat and cholesterol intake for overall cardiovascular health. 5. Maintaining adequate dietary potassium, calcium, and magnesium intake. 6. Regular monitoring of the blood pressure. 7. Reduce sodium intake to less than 100 mmol/day (less than 2.3 gm of sodium or less than 6 gm of sodium choride)      General Counseling: I have discussed the findings of the evaluation and examination with [facility].  I have also discussed any further diagnostic evaluation thatmay be needed or ordered today. Makana verbalizes understanding of the findings of todays visit. We also reviewed his medications today and discussed drug interactions and side effects including but not limited excessive drowsiness and altered mental states. We also discussed that there is always a risk not just to him but also people around him. he has been encouraged to call the office with any questions or concerns that should arise related to todays visit.  No orders of the defined types were placed in this encounter.       I have personally obtained a history, examined the patient, evaluated laboratory and imaging results,  formulated the assessment and plan and placed orders. This patient was seen today by Emmaline Kluver, PA-C in collaboration with Dr. Freda Munro.   Yevonne Pax, MD [facility] Diplomate ABMS Pulmonary Critical Care Medicine and Sleep Medicine

## 2023-06-17 NOTE — Patient Instructions (Signed)

## 2024-06-06 NOTE — Progress Notes (Unsigned)
 [facility] [address] [place], [place] [address]  Pulmonary Sleep Medicine   Office Visit Note  Patient Name: John Stephens DOB: June 29, 1971 MRN [id number]    Chief Complaint: Obstructive Sleep Apnea visit  Brief History:  Tillman is seen today for an annual follow up on CPAP @ 13 cmH2O. The patient has a 2 year history of sleep apnea. Patient is using PAP nightly.  The patient feels somewhat rested after sleeping with PAP.  The patient reports benefiting from PAP use. Reported sleepiness is  improved and the Epworth Sleepiness Score is 6 out of 24. The patient does occasionally take naps. The patient complains of the following: pt complained of rain out in tubing and mask. Addressed by turning down humidity.  The compliance download shows 87% compliance with an average use time of 4 hours 43 minutes. The AHI is 0.9  The patient does occasionally complain of limb movements disrupting sleep. The patient continues to require PAP therapy in order to eliminate sleep apnea.   ROS  General: (-) fever, (-) chills, (-) night sweat Nose and Sinuses: (-) nasal stuffiness or itchiness, (-) postnasal drip, (-) nosebleeds, (-) sinus trouble. Mouth and Throat: (-) sore throat, (-) hoarseness. Neck: (-) swollen glands, (-) enlarged thyroid, (-) neck pain. Respiratory: - cough, - shortness of breath, - wheezing. Neurologic: - numbness, - tingling. Psychiatric: - anxiety, - depression   Current Medication: Outpatient Encounter Medications as of 06/08/2024  Medication Sig   aspirin 81 MG tablet Take 81 mg by mouth daily.   atorvastatin (LIPITOR) 40 MG tablet Take 1 tablet by mouth daily.   cholecalciferol (VITAMIN D) 1000 UNITS tablet Take 400 Units by mouth daily.   Cyanocobalamin (VITAMIN B-12 PO) Take 1 tablet by mouth daily.   fluticasone  (FLONASE ) 50 MCG/ACT nasal spray    glipiZIDE (GLUCOTROL) 10 MG tablet Take by mouth.   ibuprofen  (ADVIL ) 600 MG tablet Take 1 tablet (600 mg  total) by mouth every 6 (six) hours as needed.   JARDIANCE 25 MG TABS tablet Take 25 mg by mouth daily.   levocetirizine (XYZAL ) 5 MG tablet Take 1 tablet (5 mg total) by mouth every evening.   lisinopril (ZESTRIL) 40 MG tablet Take 40 mg by mouth daily.   Multiple Vitamin (MULTIVITAMIN) tablet Take 1 tablet by mouth daily.   omeprazole (PRILOSEC) 20 MG capsule Take 20 mg by mouth daily.   pioglitazone-metformin (ACTOPLUS MET) 15-850 MG tablet 3 (three) times a day.   RYBELSUS 14 MG TABS Take 1 tablet by mouth daily.   sildenafil (REVATIO) 20 MG tablet Take 1 - 5 tablets half an hour before sexual intercourse   triamcinolone  ointment (KENALOG ) 0.1 % Apply 1 application topically 2 (two) times daily. Please disregard prior Rx. Give patient Jar.   trimethoprim -polymyxin b  (POLYTRIM ) ophthalmic solution Place 1 drop into both eyes every 4 (four) hours.   No facility-administered encounter medications on file as of 06/08/2024.    Surgical History: Past Surgical History:  Procedure Laterality Date   NO PAST SURGERIES      Medical History: Past Medical History:  Diagnosis Date   Diabetes mellitus without complication (HCC)    ED (erectile dysfunction)    Hemorrhoids    Hypercholesterolemia    Hypertension     Family History: Non contributory to the present illness  Social History: Social History   Socioeconomic History   Marital status: Widowed    Spouse name: Not on file   Number of children:  Not on file   Years of education: Not on file   Highest education level: Not on file  Occupational History   Not on file  Tobacco Use   Smoking status: Never   Smokeless tobacco: Never  Vaping Use   Vaping status: Never Used  Substance and Sexual Activity   Alcohol use: No   Drug use: No   Sexual activity: Not on file  Other Topics Concern   Not on file  Social History Narrative   ** Merged History Encounter **       Social Drivers of Health   Financial Resource Strain:  Not on file  Food Insecurity: Not on file  Transportation Needs: Not on file  Physical Activity: Not on file  Stress: Not on file  Social Connections: Not on file  Intimate Partner Violence: Not on file    Vital Signs: Blood pressure 132/77, pulse 86, resp. rate 16, height 5' 4 (1.626 m), weight 225 lb (102.1 kg), SpO2 98%. Body mass index is 38.62 kg/m.    Examination: General Appearance: The patient is well-developed, well-nourished, and in no distress. Neck Circumference: 45 cm Skin: Gross inspection of skin unremarkable. Head: normocephalic, no gross deformities. Eyes: no gross deformities noted. ENT: ears appear grossly normal Neurologic: Alert and oriented. No involuntary movements.  STOP BANG RISK ASSESSMENT S (snore) Have you been told that you snore?     NO   T (tired) Are you often tired, fatigued, or sleepy during the day?   NO  O (obstruction) Do you stop breathing, choke, or gasp during sleep? NO   P (pressure) Do you have or are you being treated for high blood pressure? YES   B (BMI) Is your body index greater than 35 kg/m? YES   A (age) Are you 1 years old or older? YES   N (neck) Do you have a neck circumference greater than 16 inches?   YES   G (gender) Are you a male? YES   TOTAL STOP/BANG "YES" ANSWERS 5       A STOP-Bang score of 2 or less is considered low risk, and a score of 5 or more is high risk for having either moderate or severe OSA. For people who score 3 or 4, doctors may need to perform further assessment to determine how likely they are to have OSA.         EPWORTH SLEEPINESS SCALE:  Scale:  (0)= no chance of dozing; (1)= slight chance of dozing; (2)= moderate chance of dozing; (3)= high chance of dozing  Chance  Situtation    Sitting and reading: 0    Watching TV: 2    Sitting Inactive in public: 0    As a passenger in car: 0      Lying down to rest: 3    Sitting and talking: 0    Sitting quielty after lunch: 1     In a car, stopped in traffic: 0   TOTAL SCORE:   6 out of 24    SLEEP STUDIES:  HST (07/2022) REI 16.8/hr, min SpO2 76% Titration (09/2022) CPAP@ 13 cmH2O   CPAP COMPLIANCE DATA:  Date Range: 06/06/2023 - 06/04/2024  Average Daily Use: 5 hours  Median Use: 5 hour 2 minutes  Compliance for > 4 Hours: 87%days  AHI: 0.9 respiratory events per hour  Days Used: 344/365  Mask Leak: 4.1  95th Percentile Pressure: 13 cmH2O         LABS: No results  found for this or any previous visit (from the past 2160 hours).  Radiology: No results found.  No results found.  No results found.    Assessment and Plan: Patient Active Problem List   Diagnosis Date Noted   CPAP use counseling 06/17/2023   OSA (obstructive sleep apnea) 07/09/2022   GERD (gastroesophageal reflux disease) 07/09/2022   Obesity (BMI 30-39.9) 07/09/2022   Morbid obesity (HCC) 07/09/2022   Hypercholesterolemia 01/02/2017   Anal bleeding 05/16/2015   Hemorrhoids, external, thrombosed 05/16/2015   Essential hypertension 07/14/2014   Controlled type 2 diabetes mellitus without complication, without long-term current use of insulin (HCC) 07/14/2014   1. OSA (obstructive sleep apnea) (Primary) The patient does tolerate PAP and reports  benefit from PAP use. The patient was reminded how to clean equipment and advised to replace supplies routinely. The patient was also counselled on weight loss. The compliance is good. The AHI is 0.9.   OSA on cpap- controlled. Continue with excellent compliance with pap. CPAP continues to be medically necessary to treat this patient's OSA. F/u one year.    2. CPAP use counseling CPAP Counseling: had a lengthy discussion with the patient regarding the importance of PAP therapy in management of the sleep apnea. Patient appears to understand the risk factor reduction and also understands the risks associated with untreated sleep apnea. Patient will try to make a good  faith effort to remain compliant with therapy. Also instructed the patient on proper cleaning of the device including the water must be changed daily if possible and use of distilled water is preferred. Patient understands that the machine should be regularly cleaned with appropriate recommended cleaning solutions that do not damage the PAP machine for example given white vinegar and water rinses. Other methods such as ozone treatment may not be as good as these simple methods to achieve cleaning.   3. Essential hypertension Controlled with lisinopril, continue.      General Counseling: I have discussed the findings of the evaluation and examination with [facility].  I have also discussed any further diagnostic evaluation thatmay be needed or ordered today. Abass verbalizes understanding of the findings of todays visit. We also reviewed his medications today and discussed drug interactions and side effects including but not limited excessive drowsiness and altered mental states. We also discussed that there is always a risk not just to him but also people around him. he has been encouraged to call the office with any questions or concerns that should arise related to todays visit.  No orders of the defined types were placed in this encounter.       I have personally obtained a history, examined the patient, evaluated laboratory and imaging results, formulated the assessment and plan and placed orders. This patient was seen today by Lauraine Lay, PA-C in collaboration with Dr. Elfreda Bathe.   Elfreda DELENA Bathe, MD [facility] Diplomate ABMS Pulmonary Critical Care Medicine and Sleep Medicine

## 2024-06-08 ENCOUNTER — Ambulatory Visit (INDEPENDENT_AMBULATORY_CARE_PROVIDER_SITE_OTHER): Admitting: Internal Medicine

## 2024-06-08 VITALS — BP 132/77 | HR 86 | Resp 16 | Ht 64.0 in | Wt 225.0 lb

## 2024-06-08 DIAGNOSIS — G4733 Obstructive sleep apnea (adult) (pediatric): Secondary | ICD-10-CM

## 2024-06-08 DIAGNOSIS — Z7189 Other specified counseling: Secondary | ICD-10-CM

## 2024-06-08 DIAGNOSIS — I10 Essential (primary) hypertension: Secondary | ICD-10-CM

## 2024-06-08 NOTE — Patient Instructions (Signed)
